# Patient Record
Sex: Female | Born: 1980 | Race: Black or African American | Hispanic: No | Marital: Single | Smoking: Never smoker
Health system: Southern US, Community
[De-identification: ages and names within clinical notes are randomized; demographics above are authoritative.]

## PROBLEM LIST (undated history)

## (undated) DIAGNOSIS — T7840XA Allergy, unspecified, initial encounter: Secondary | ICD-10-CM

---

## 2021-07-29 ENCOUNTER — Encounter (HOSPITAL_COMMUNITY): Payer: Self-pay | Admitting: Emergency Medicine

## 2021-07-29 ENCOUNTER — Emergency Department (HOSPITAL_COMMUNITY): Payer: Medicaid Other

## 2021-07-29 ENCOUNTER — Inpatient Hospital Stay (HOSPITAL_COMMUNITY)
Admission: EM | Admit: 2021-07-29 | Discharge: 2021-07-31 | DRG: 880 | Disposition: A | Discharge status: Home / Self Care | Payer: Medicaid Other | Attending: Neurology | Admitting: Neurology

## 2021-07-29 ENCOUNTER — Other Ambulatory Visit: Payer: Self-pay

## 2021-07-29 DIAGNOSIS — M62838 Other muscle spasm: Secondary | ICD-10-CM | POA: Diagnosis present

## 2021-07-29 DIAGNOSIS — F447 Conversion disorder with mixed symptom presentation: Principal | ICD-10-CM | POA: Diagnosis present

## 2021-07-29 DIAGNOSIS — R29701 NIHSS score 1: Secondary | ICD-10-CM | POA: Diagnosis present

## 2021-07-29 DIAGNOSIS — Z91013 Allergy to seafood: Secondary | ICD-10-CM | POA: Diagnosis not present

## 2021-07-29 DIAGNOSIS — Z6834 Body mass index (BMI) 34.0-34.9, adult: Secondary | ICD-10-CM | POA: Diagnosis not present

## 2021-07-29 DIAGNOSIS — F4489 Other dissociative and conversion disorders: Secondary | ICD-10-CM | POA: Diagnosis not present

## 2021-07-29 DIAGNOSIS — E669 Obesity, unspecified: Secondary | ICD-10-CM | POA: Diagnosis present

## 2021-07-29 DIAGNOSIS — R2990 Unspecified symptoms and signs involving the nervous system: Secondary | ICD-10-CM | POA: Diagnosis not present

## 2021-07-29 DIAGNOSIS — E785 Hyperlipidemia, unspecified: Secondary | ICD-10-CM | POA: Diagnosis not present

## 2021-07-29 DIAGNOSIS — Z20822 Contact with and (suspected) exposure to covid-19: Secondary | ICD-10-CM | POA: Diagnosis present

## 2021-07-29 DIAGNOSIS — T781XXA Other adverse food reactions, not elsewhere classified, initial encounter: Secondary | ICD-10-CM | POA: Diagnosis present

## 2021-07-29 DIAGNOSIS — Z79899 Other long term (current) drug therapy: Secondary | ICD-10-CM

## 2021-07-29 DIAGNOSIS — I639 Cerebral infarction, unspecified: Secondary | ICD-10-CM | POA: Diagnosis present

## 2021-07-29 DIAGNOSIS — I6389 Other cerebral infarction: Secondary | ICD-10-CM | POA: Diagnosis not present

## 2021-07-29 DIAGNOSIS — G8194 Hemiplegia, unspecified affecting left nondominant side: Secondary | ICD-10-CM | POA: Diagnosis present

## 2021-07-29 DIAGNOSIS — Z713 Dietary counseling and surveillance: Secondary | ICD-10-CM

## 2021-07-29 DIAGNOSIS — T7840XA Allergy, unspecified, initial encounter: Principal | ICD-10-CM

## 2021-07-29 DIAGNOSIS — R29810 Facial weakness: Secondary | ICD-10-CM | POA: Diagnosis present

## 2021-07-29 DIAGNOSIS — I10 Essential (primary) hypertension: Secondary | ICD-10-CM | POA: Diagnosis present

## 2021-07-29 HISTORY — DX: Allergy, unspecified, initial encounter: T78.40XA

## 2021-07-29 LAB — DIFFERENTIAL
Abs Immature Granulocytes: 0.06 10*3/uL (ref 0.00–0.07)
Basophils Absolute: 0 10*3/uL (ref 0.0–0.1)
Basophils Relative: 0 %
Eosinophils Absolute: 0 10*3/uL (ref 0.0–0.5)
Eosinophils Relative: 0 %
Immature Granulocytes: 1 %
Lymphocytes Relative: 12 %
Lymphs Abs: 1.3 10*3/uL (ref 0.7–4.0)
Monocytes Absolute: 0.3 10*3/uL (ref 0.1–1.0)
Monocytes Relative: 2 %
Neutro Abs: 9.1 10*3/uL — ABNORMAL HIGH (ref 1.7–7.7)
Neutrophils Relative %: 85 %

## 2021-07-29 LAB — CBC
HCT: 41.7 % (ref 36.0–46.0)
Hemoglobin: 14.3 g/dL (ref 12.0–15.0)
MCH: 31.5 pg (ref 26.0–34.0)
MCHC: 34.3 g/dL (ref 30.0–36.0)
MCV: 91.9 fL (ref 80.0–100.0)
Platelets: 304 10*3/uL (ref 150–400)
RBC: 4.54 MIL/uL (ref 3.87–5.11)
RDW: 13 % (ref 11.5–15.5)
WBC: 10.8 10*3/uL — ABNORMAL HIGH (ref 4.0–10.5)
nRBC: 0 % (ref 0.0–0.2)

## 2021-07-29 LAB — COMPREHENSIVE METABOLIC PANEL
ALT: 19 U/L (ref 0–44)
AST: 30 U/L (ref 15–41)
Albumin: 3.6 g/dL (ref 3.5–5.0)
Alkaline Phosphatase: 72 U/L (ref 38–126)
Anion gap: 7 (ref 5–15)
BUN: 7 mg/dL (ref 6–20)
CO2: 23 mmol/L (ref 22–32)
Calcium: 9.3 mg/dL (ref 8.9–10.3)
Chloride: 108 mmol/L (ref 98–111)
Creatinine, Ser: 0.92 mg/dL (ref 0.44–1.00)
GFR, Estimated: >60 mL/min (ref >60)
Glucose, Bld: 107 mg/dL — ABNORMAL HIGH (ref 70–99)
Potassium: 3.9 mmol/L (ref 3.5–5.1)
Sodium: 138 mmol/L (ref 135–145)
Total Bilirubin: 0.5 mg/dL (ref 0.3–1.2)
Total Protein: 7.6 g/dL (ref 6.5–8.1)

## 2021-07-29 LAB — APTT: aPTT: 31 seconds (ref 24–36)

## 2021-07-29 LAB — CBG MONITORING, ED: Glucose-Capillary: 109 mg/dL — ABNORMAL HIGH (ref 70–99)

## 2021-07-29 LAB — RESP PANEL BY RT-PCR (FLU A&B, COVID) ARPGX2
Influenza A by PCR: NEGATIVE
Influenza B by PCR: NEGATIVE
SARS Coronavirus 2 by RT PCR: NEGATIVE

## 2021-07-29 LAB — PROTIME-INR
INR: 0.9 (ref 0.8–1.2)
Prothrombin Time: 12.4 seconds (ref 11.4–15.2)

## 2021-07-29 LAB — I-STAT BETA HCG BLOOD, ED (MC, WL, AP ONLY): I-stat hCG, quantitative: <5 m[IU]/mL (ref <5)

## 2021-07-29 LAB — ETHANOL: Alcohol, Ethyl (B): <10 mg/dL (ref <10)

## 2021-07-29 IMAGING — CT CT HEAD CODE STROKE
3 series · 15 of 47 positions shown, 18 images · non-contrast
Comparison: None.

CLINICAL DATA: Code stroke.  Allergic reaction



[Series 4: head wo · axial · 0.44mm/px · z∈[+1198,+1333]mm · 9 of 33 slices shown, 12 images]
[im 3/33  brain]
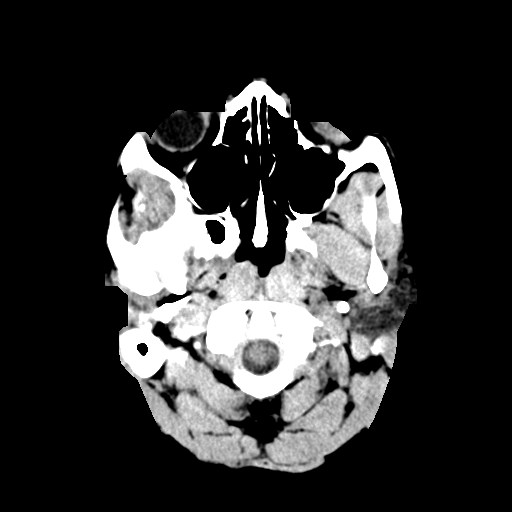
[im 3/33  bone]
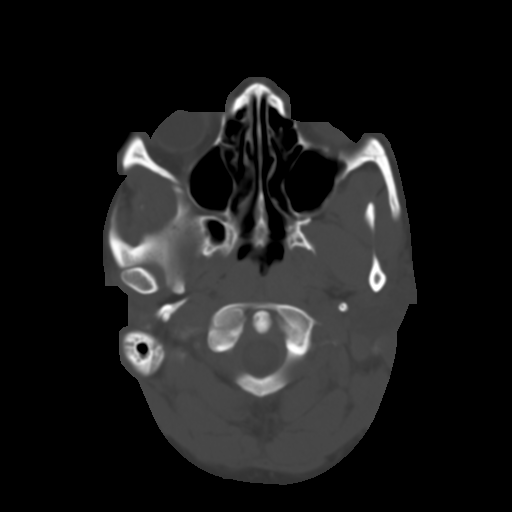
[im 6/33  brain]
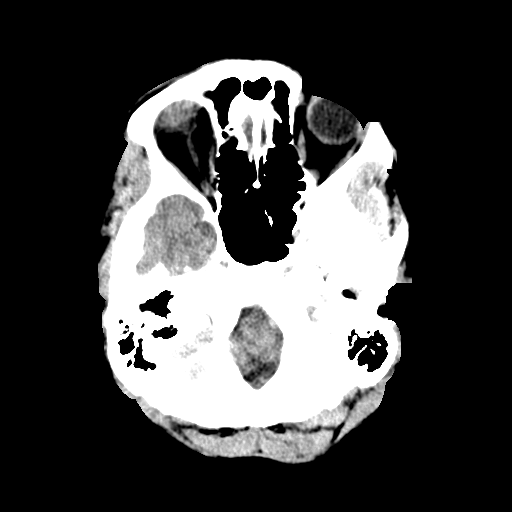
[im 9/33  brain]
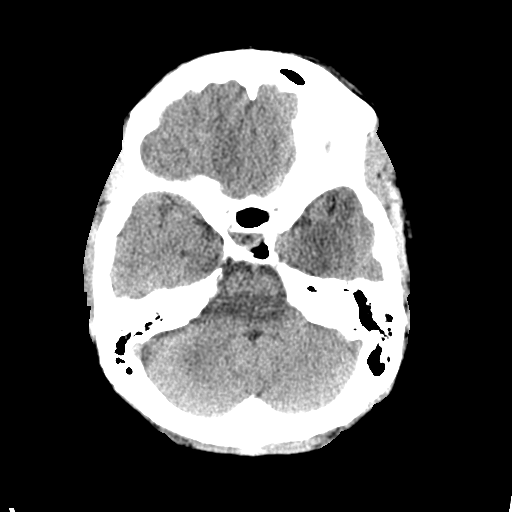
[im 13/33  brain]
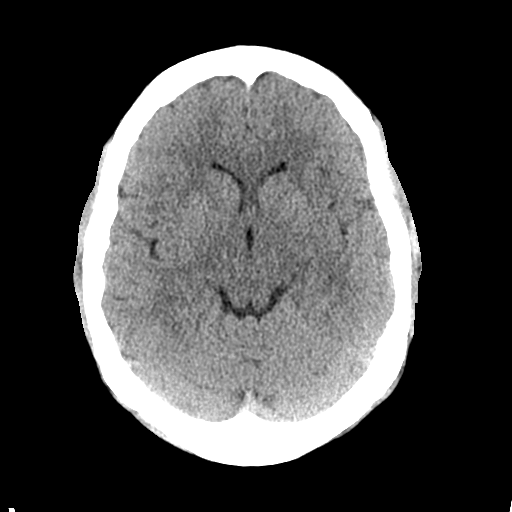
[im 17/33  brain]
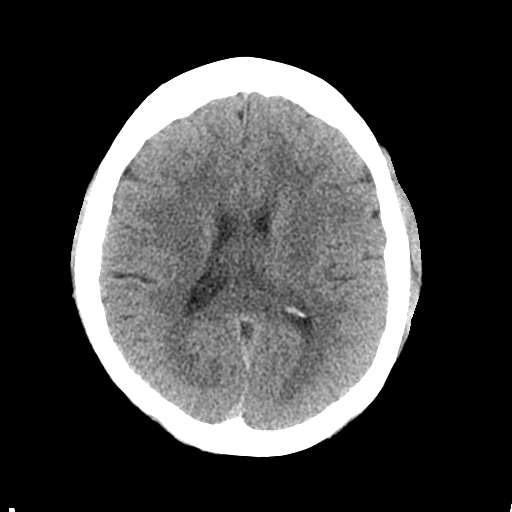
[im 17/33  bone]
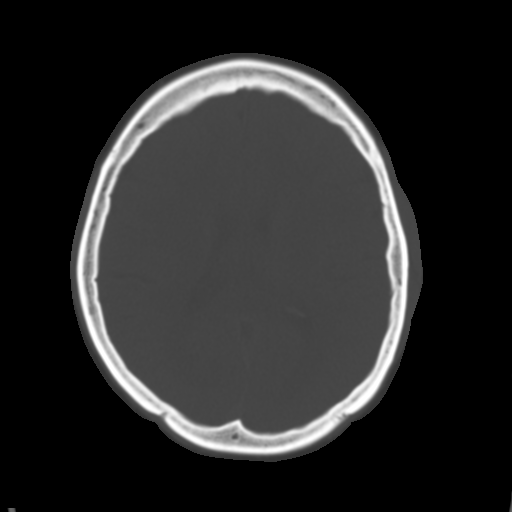
[im 20/33  brain]
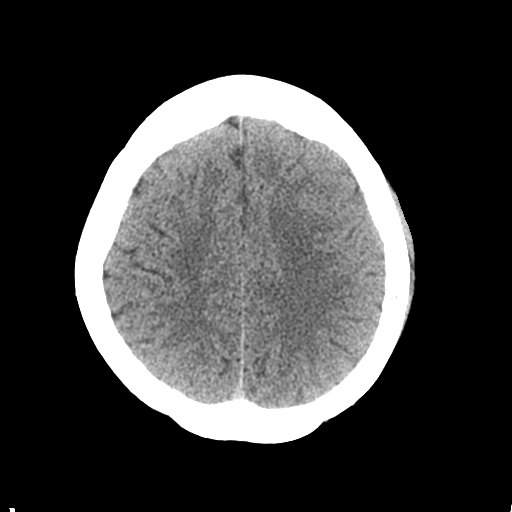
[im 24/33  brain]
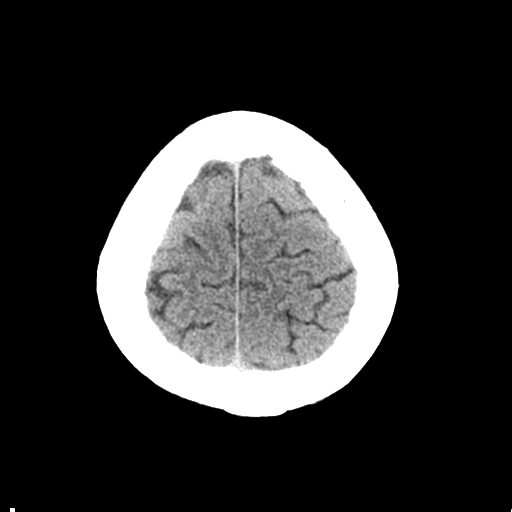
[im 27/33  brain]
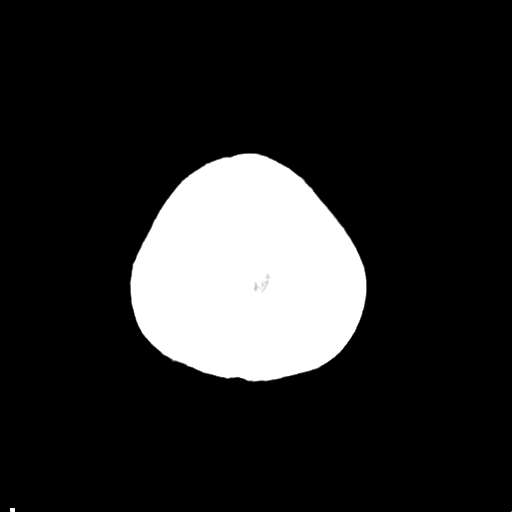
[im 30/33  brain]
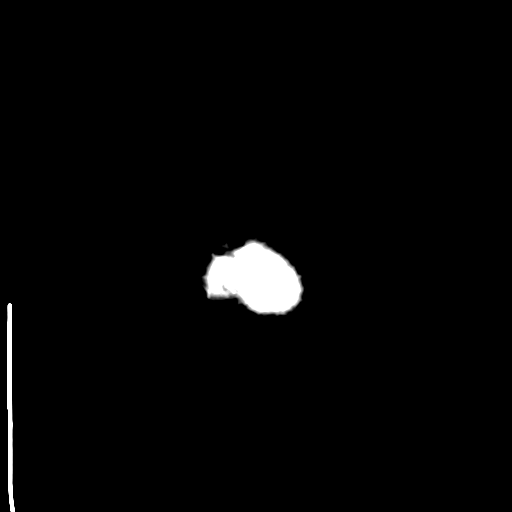
[im 30/33  bone]
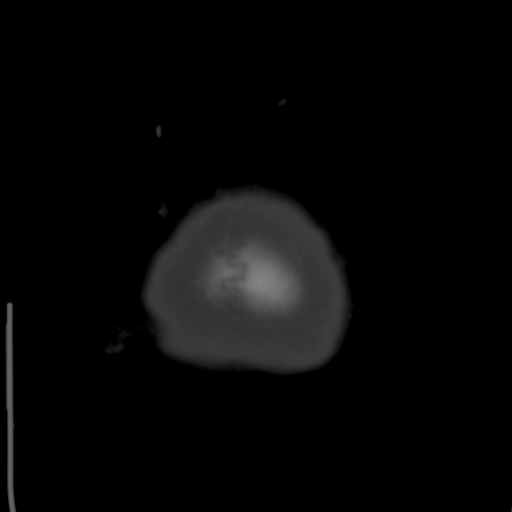

[Series 7: coronal soft tissue · coronal · 0.33mm/px · 3 of 72 slices shown]
[im 24/72  brain]
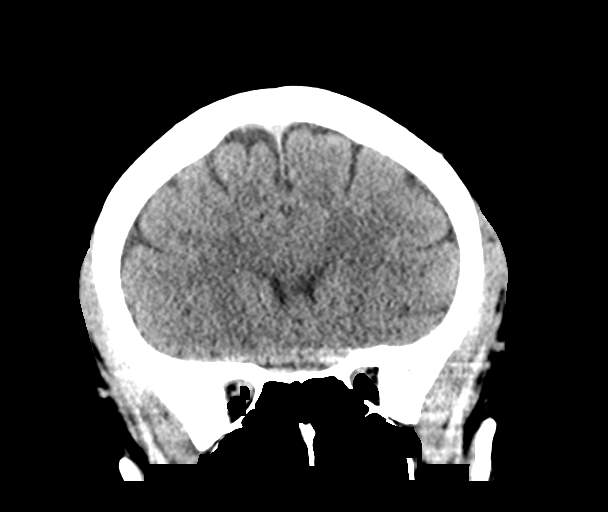
[im 32/72  brain]
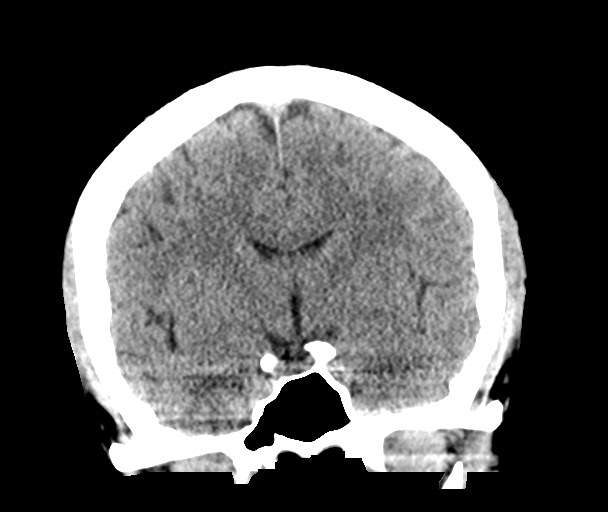
[im 40/72  brain]
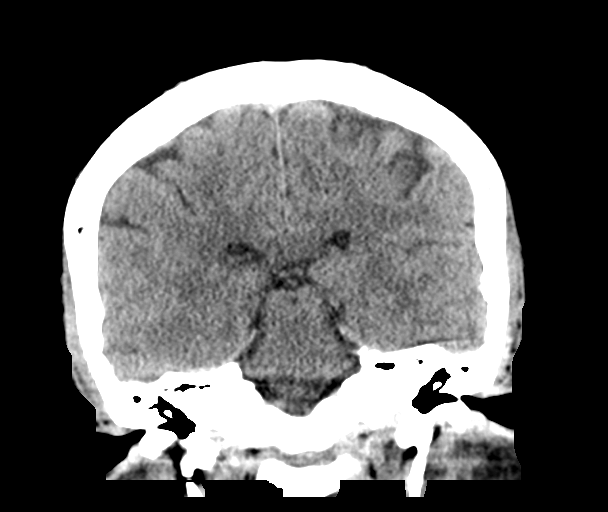

[Series 8: sagittal soft tissue · sagittal · 0.32mm/px · 3 of 64 slices shown]
[im 22/64  brain]
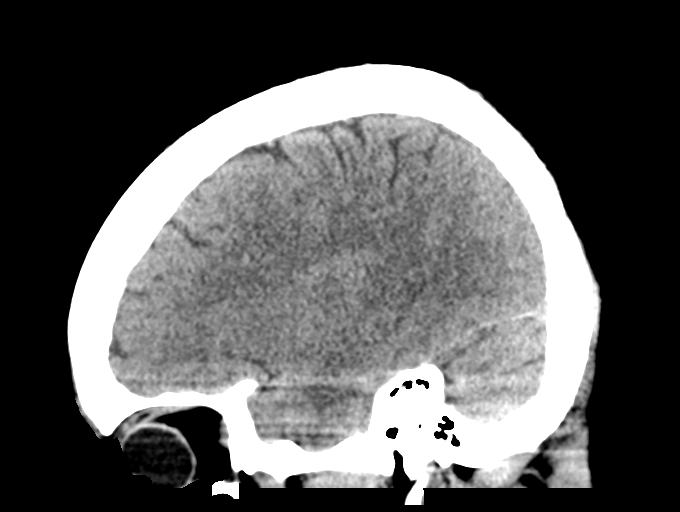
[im 32/64  brain]
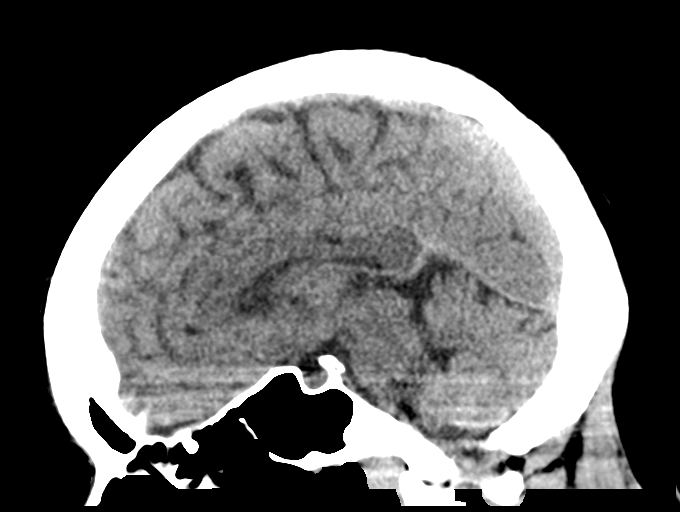
[im 43/64  brain]
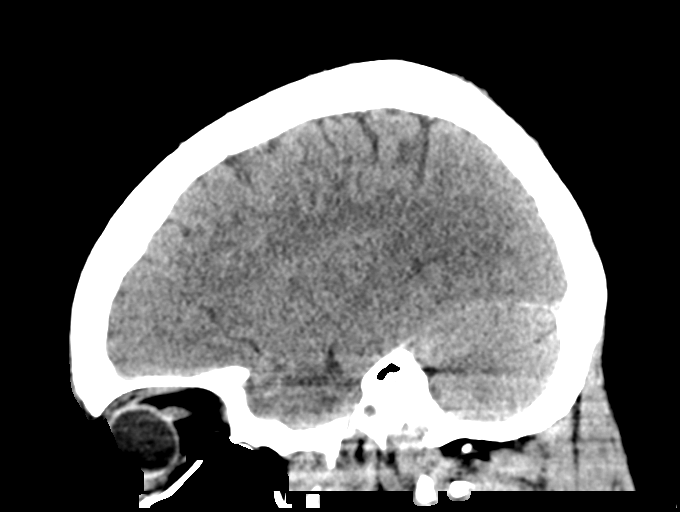

[15 of 47 positions shown; findings below may reference images not displayed]

FINDINGS: Brain: There is no mass, hemorrhage or extra-axial collection. The
size and configuration of the ventricles and extra-axial CSF spaces
are normal. The brain parenchyma is normal, without evidence of
acute or chronic infarction.

Vascular: No abnormal hyperdensity of the major intracranial
arteries or dural venous sinuses. No intracranial atherosclerosis.

Skull: The visualized skull base, calvarium and extracranial soft
tissues are normal.

Sinuses/Orbits: No fluid levels or advanced mucosal thickening of
the visualized paranasal sinuses. No mastoid or middle ear effusion.
The orbits are normal.

ASPECTS (Alberta Stroke Program Early CT Score)

- Ganglionic level infarction (caudate, lentiform nuclei, internal
capsule, insula, M1-M3 cortex): 7

- Supraganglionic infarction (M4-M6 cortex): 3

Total score (0-10 with 10 being normal): 10
IMPRESSION: 1. Normal head CT.
2. ASPECTS is 10.

## 2021-07-29 MED ORDER — SODIUM CHLORIDE (PF) 0.9 % IJ SOLN
INTRAMUSCULAR | Status: AC
  Filled 2021-07-29: qty 100

## 2021-07-29 MED ORDER — FENTANYL CITRATE PF 50 MCG/ML IJ SOSY
25.0000 ug | PREFILLED_SYRINGE | Freq: Once | INTRAMUSCULAR | Status: AC
  Administered 2021-07-29: 25 ug via INTRAVENOUS
  Filled 2021-07-29: qty 1

## 2021-07-29 MED ORDER — FAMOTIDINE IN NACL 20-0.9 MG/50ML-% IV SOLN
20.0000 mg | Freq: Once | INTRAVENOUS | Status: AC
  Administered 2021-07-29: 20 mg via INTRAVENOUS
  Filled 2021-07-29: qty 50

## 2021-07-29 MED ORDER — SODIUM CHLORIDE 0.9 % IV BOLUS
500.0000 mL | Freq: Once | INTRAVENOUS | Status: AC
  Administered 2021-07-29: 500 mL via INTRAVENOUS

## 2021-07-29 MED ORDER — METHYLPREDNISOLONE SODIUM SUCC 125 MG IJ SOLR
125.0000 mg | Freq: Once | INTRAMUSCULAR | Status: AC
  Administered 2021-07-29: 125 mg via INTRAVENOUS
  Filled 2021-07-29: qty 2

## 2021-07-29 MED ORDER — KETOROLAC TROMETHAMINE 15 MG/ML IJ SOLN
15.0000 mg | Freq: Once | INTRAMUSCULAR | Status: AC
  Administered 2021-07-29: 15 mg via INTRAVENOUS
  Filled 2021-07-29: qty 1

## 2021-07-29 MED ORDER — IPRATROPIUM-ALBUTEROL 0.5-2.5 (3) MG/3ML IN SOLN
3.0000 mL | Freq: Once | RESPIRATORY_TRACT | Status: AC
  Administered 2021-07-29: 3 mL via RESPIRATORY_TRACT
  Filled 2021-07-29: qty 3

## 2021-07-29 MED ORDER — TENECTEPLASE FOR STROKE
0.2500 mg/kg | PACK | Freq: Once | INTRAVENOUS | Status: AC
  Administered 2021-07-29: 21 mg via INTRAVENOUS
  Filled 2021-07-29: qty 10

## 2021-07-29 NOTE — Progress Notes (Signed)
PHARMACIST CODE STROKE RESPONSE ? ?Notified to mix TNK at 2139 by Dr. Blanca Friend ?Delivered TNK to RN, Maylon Cos, at 2145 ? ?TNK dose = 21 mg IV over 5 seconds ? ?Issues/delays encountered (if applicable): N/A ? ?Lindell Spar, PharmD, BCPS ?Clinical Pharmacist  ?07/29/21 10:08 PM ? ?

## 2021-07-29 NOTE — ED Provider Notes (Signed)
?[facility]-EMERGENCY DEPT ?Provider Note ? ? ?CSN: [id number] ?Arrival date & time: 07/29/21  1610 ? ?  ? ?History ? ?Chief Complaint  ?Patient presents with  ? Allergic Reaction  ? ? ?[name] [name] is a 41 y.o. female. ? ? ?Allergic Reaction ?Presenting symptoms: no rash   ?Patient presents after allergic reaction.  Given epinephrine by EMS.  Reportedly was exposed to some seafood in the break room.  States that she is allergic.  Did not eat any food that she was worried about.  Some difficulty breathing.  No lightheadedness.  No fevers.  Has had to come to the ER before but never had to be intubated for allergic reaction.  Feels as if she is having trouble breathing. ?  ? ?Home Medications ?Prior to Admission medications   ?Medication Sig Start Date End Date Taking? Authorizing Provider  ?amitriptyline (ELAVIL) 50 MG tablet Take 50 mg by mouth at bedtime.   Yes [provider]  ?OVER THE COUNTER MEDICATION Take 1 tablet by mouth daily. Nitro-Oxygen Booster   Yes [provider]  ?Turmeric 500 MG CAPS Take 1 capsule by mouth daily.   Yes [provider]  ?   ? ?Allergies    ?Iodine, Shellfish allergy, Benadryl [diphenhydramine], and Demerol [meperidine hcl]   ? ?Review of Systems   ?Review of Systems  ?Constitutional:  Negative for appetite change.  ?Respiratory:  Positive for shortness of breath.   ?Cardiovascular:  Negative for chest pain.  ?Gastrointestinal:  Negative for abdominal pain.  ?Skin:  Negative for rash.  ? ?Physical Exam ?Updated Vital Signs ?BP 120/76   Pulse 83   Temp 97.7 ?F (36.5 ?C)   Resp 12   Ht 5\' 2"  (1.575 m)   Wt 85.7 kg   SpO2 98%   BMI 34.57 kg/m?  ?Physical Exam ?Vitals and nursing note reviewed.  ?HENT:  ?   Head: Atraumatic.  ?Eyes:  ?   Pupils: Pupils are equal, round, and reactive to light.  ?Cardiovascular:  ?   Rate and Rhythm: Regular rhythm.  ?Pulmonary:  ?   Comments: Patient speaking with a quiet voice.  No frank wheezing.   No posterior pharyngeal edema seen.  No stridor. ?Musculoskeletal:     ?   General: No tenderness.  ?   Cervical back: Neck supple.  ?Skin: ?   Capillary Refill: Capillary refill takes less than 2 seconds.  ?Neurological:  ?   Mental Status: She is alert and oriented to person, place, and time.  ? ? ?ED Results / Procedures / Treatments   ?Labs ?(all labs ordered are listed, but only abnormal results are displayed) ?Labs Reviewed  ?CBC - Abnormal; Notable for the following components:  ?    Result Value  ? WBC 10.8 (*)   ? All other components within normal limits  ?DIFFERENTIAL - Abnormal; Notable for the following components:  ? Neutro Abs 9.1 (*)   ? All other components within normal limits  ?COMPREHENSIVE METABOLIC PANEL - Abnormal; Notable for the following components:  ? Glucose, Bld 107 (*)   ? All other components within normal limits  ?CBG MONITORING, ED - Abnormal; Notable for the following components:  ? Glucose-Capillary 109 (*)   ? All other components within normal limits  ?RESP PANEL BY RT-PCR (FLU A&B, COVID) ARPGX2  ?ETHANOL  ?PROTIME-INR  ?APTT  ?RAPID URINE DRUG SCREEN, HOSP PERFORMED  ?URINALYSIS, ROUTINE W REFLEX MICROSCOPIC  ?I-STAT CHEM 8, ED  ?I-STAT BETA  HCG BLOOD, ED ([facility], [facility], [facility] ONLY)  ? ? ?EKG ?None ? ?Radiology ?CT HEAD CODE STROKE WO CONTRAST ? ?Result Date: 07/29/2021 ?CLINICAL DATA:  Code stroke.  Allergic reaction EXAM: CT HEAD WITHOUT CONTRAST TECHNIQUE: Contiguous axial images were obtained from the base of the skull through the vertex without intravenous contrast. RADIATION DOSE REDUCTION: This exam was performed according to the departmental dose-optimization program which includes automated exposure control, adjustment of the mA and/or kV according to patient size and/or use of iterative reconstruction technique. COMPARISON:  None. FINDINGS: Brain: There is no mass, hemorrhage or extra-axial collection. The size and configuration of the ventricles and extra-axial CSF spaces are  normal. The brain parenchyma is normal, without evidence of acute or chronic infarction. Vascular: No abnormal hyperdensity of the major intracranial arteries or dural venous sinuses. No intracranial atherosclerosis. Skull: The visualized skull base, calvarium and extracranial soft tissues are normal. Sinuses/Orbits: No fluid levels or advanced mucosal thickening of the visualized paranasal sinuses. No mastoid or middle ear effusion. The orbits are normal. ASPECTS [facility](Alberta Stroke Program Early CT Score) - Ganglionic level infarction (caudate, lentiform nuclei, internal capsule, insula, M1-M3 cortex): 7 - Supraganglionic infarction (M4-M6 cortex): 3 Total score (0-10 with 10 being normal): 10 IMPRESSION: 1. Normal head CT. 2. ASPECTS is 10. Electronically Signed   By: Deatra RobinsonKevin  Herman M.D.   On: 07/29/2021 21:20   ? ?Procedures ?Procedures  ? ? ?Medications Ordered in ED ?Medications  ?sodium chloride (PF) 0.9 % injection (has no administration in time range)  ?famotidine (PEPCID) IVPB 20 mg premix (0 mg Intravenous Stopped 07/29/21 1717)  ?sodium chloride 0.9 % bolus 500 mL (0 mLs Intravenous Stopped 07/29/21 1752)  ?methylPREDNISolone sodium succinate (SOLU-MEDROL) 125 mg/2 mL injection 125 mg (125 mg Intravenous Given 07/29/21 1646)  ?ipratropium-albuterol (DUONEB) 0.5-2.5 (3) MG/3ML nebulizer solution 3 mL (3 mLs Nebulization Given 07/29/21 1642)  ?fentaNYL (SUBLIMAZE) injection 25 mcg (25 mcg Intravenous Given 07/29/21 1820)  ?ketorolac (TORADOL) 15 MG/ML injection 15 mg (15 mg Intravenous Given 07/29/21 1929)  ?tenecteplase (TNKASE) injection for Stroke 21 mg (21 mg Intravenous Given 07/29/21 2153)  ? ? ?ED Course/ Medical Decision Making/ A&P ?  ?                        ?Medical Decision Making ?Amount and/or Complexity of Data Reviewed ?Labs: ordered. ?Radiology: ordered. ? ?Risk ?Prescription drug management. ?Decision regarding hospitalization. ? ? ?Patient with potential allergic reaction to seafood.  History of  allergy to this.  Quiet voice initially.  Given epinephrine by EMS.  Given steroids and Pepcid here.  Also some abdominal pain.  Patient later states that she had pain on her whole left side.  Patient somewhat hesitant to move the side but does appear to have full strength.  Benign abdominal exam. ? ?With further evaluation patient may now be developing a little bit of a facial droop.  Still able to lift left side but states she now cannot feel me touching on that side.  Pain somewhat improved.  Code stroke called.  Last normal had been approximately 6:00 today. ? ?Seen by teleneurology.  For his exam states it was just some weakness in the left leg but will not walk.  Low NIH score but things would be debilitating.  He is worried that she has had previous hemorrhagic cyst.  Discussed with gynecology and thinks this is not an issue to worry about with tPA.  Neurology also thought that the  patient could have conversion reaction.  However with the weakness in the leg he elected to order TNK.  I discussed with Dr. Amada Jupiter from neurology, who accepted patient in transfer to [facility]. ? 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  Will not give CTA at this time because of that.  Has had steroids but she is already here for allergic reaction adding a potential allergen at this time I think is higher risk.  We will likely get MRI at [facility] ? ?CRITICAL CARE ?Performed by: Benjiman Core ?Total critical care time: 30 minutes ?Critical care time was exclusive of separately billable procedures and treating other patients. ?Critical care was necessary to treat or prevent imminent or life-threatening deterioration. ?Critical care was time spent personally by me on the following activities: development of treatment plan with patient and/or surrogate as well as nursing, discussions with consultants, evaluation of patient's response to treatment, examination of patient, obtaining history from patient or  surrogate, ordering and performing treatments and interventions, ordering and review of laboratory studies, ordering and review of radiographic studies, pulse oximetry and re-evaluation of patient's condition. ? ? ?

## 2021-07-29 NOTE — Consult Note (Addendum)
[facility] ?[facility] Tele[facility]urology Consult Services ? ? ?Patient Name:   [name] [name] ?Date of Birth:   Sep 30, 1980 ?Identification Number:   MRN - [id number] ?Date of Service:   07/29/2021 21:15:57 ? ?Diagnosis: ?      R53.1 - Weakness ? ?Impression: ?     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 Fire department gave her epi and transported her to the hospital. While in the ED patient developed left facial droop and left sided weakness after she returned from using the bathroom. Last known well time was 20:33. Exam is significant for left leg drift. Patient is not able to ambulate without assistance. Risks and benefits of IV TNK was discussed with patient including a approximate 3 percent risk of symptomatic intracranial hemorrhage, and internal bleeding. I also discussed some of the potential benefits of IV alteplase to include an approximate 30 % chance of improvement at 3 months. ED provider discussed the patient having a history hemorrhagic ovarian cyst with OBGYN and OBGYN considered the hemorrhagic ovarian cyst to be a negligible bleed risk with thrombolytic therapy. IV TNK was given.  ? Recommend admission for stroke work up. ? ?Our recommendations are outlined below. ?Recommendations: ?IV Tenecteplase recommended. ? ?I confirmed the following. ?(Patient name, DOB, MRN, dose of Thrombolytic and waste, weight completed by stretcher/scale not stated weight, have ED staff inform ED MD of thrombolytic decision) ?Thrombolytic bolus given Without Complication. ? ? ?IV Tenecteplase Total Dose - 21.4 mg ? ? ?Routine post Thrombolytic monitoring including neuro checks and blood pressure control during/after treatment Monitor blood pressure Check blood pressure and neuro assessment every 15 min for 2 h, then every 30 min for 6 h, and finally  every hour for 16 h. ? ?Manage Blood Pressure per post Thrombolytic protocol. ? ?      Follow designated hospital protocol for admission and post thrombolytic care ?      CT brain 24 hours post Thrombolytic ?      NPO until swallowing screen performed and passed ?      No antiplatelet agents or anticoagulants (including heparin for DVT prophylaxis) in first 24 hours ?      No Foley catheter, nasogastric tube, arterial catheter or central venous catheter for 24 hr, unless absolutely necessary ?      Telemetry ?      Bedside swallow evaluation ?      HOB less than 30 degrees ?      Euglycemia ?      Avoid hyperthermia, PRN acetaminophen ?      DVT prophylaxis ?      Inpatient Neurology Consultation ?      Stroke evaluation as per inpatient neurology recommendations ? ?Discussed with ED physician ? ? ? ?------------------------------------------------------------------------------ ? ?Advanced Imaging: ?Advanced Imaging Deferred because: ? ?Non-disabling symptoms as verified by the patient; no cortical signs so not consistent with LVO ? ? ?Metrics: ?Last Known Well: 07/29/2021 20:33:00 ?[facility] Notification Time: 07/29/2021 21:15:57 ?Stamp Time: 07/29/2021 21:15:57 ?Initial Response Time: 07/29/2021 21:19:22 ?Symptoms: Allergy reaction and right leg weakness. Marland Kitchen ?NIHSS Start Assessment Time: 07/29/2021 22:24:00 ?Patient is a candidate for Thrombolytic. ?Thrombolytic Medical Decision: 07/29/2021 21:38:09 ?Needle Time: 07/29/2021 21:53:00 ?Weight Noted by Staff: 85.7 kg ? ?CT head showed no acute hemorrhage or acute core infarct. ? ?Primary Provider Notified of Diagnostic Impression and  Management Plan on: 07/29/2021 22:17:37 ? ? ? ?------------------------------------------------------------------------------ ? ?Thrombolytic Contraindications: ? ?Last Known Well > 4.5 hours: No ?CT Head showing hemorrhage: No ?Ischemic stroke within 3 months: No ?Severe head trauma within 3 months: No ?Intracranial/intraspinal  surgery within 3 months: No ?History of intracranial hemorrhage: No ?Symptoms and signs consistent with an SAH: No ?GI malignancy or GI bleed within 21 days: No ?Coagulopathy: Platelets <100 000 /mm3, INR >1.7, aPTT>40 s, or PT >15 s: No ?Treatment dose of LMWH within the previous 24 hrs: No ?Use of NOACs in past 48 hours: No ?Glycoprotein IIb/IIIa receptor inhibitors use: No ?Symptoms consistent with infective endocarditis: No ?Suspected aortic arch dissection: No ?Intra-axial intracranial neoplasm: No ? ? ?History of Present Illness: ?Patient is a 41 year old Female. ? 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 Fire department gave her epi and transported her to the hospital. While in the ED patient developed left facial droop and left sided weakness after she returned from using the bathroom. Last known well time was 20:33. Exam is significant for left leg drift. Patient is not able to ambulate without assistance. Risks and benefits of IV TNK was discussed with patient including a approximate 3 percent risk of symptomatic intracranial hemorrhage, and internal bleeding. I also discussed some of the potential benefits of IV alteplase to include an approximate 30 % chance of improvement at 3 months. ED provider discussed the patient having a history hemorrhagic ovarian cyst with OBGYN and OBGYN considered the hemorrhagic ovarian cyst to be a negligible bleed risk with thrombolytic therapy. ? ? ?  ? ?Medications: ? ?No Anticoagulant use  ?No Antiplatelet use ?Reviewed EMR for current medications ? ?Allergies:  ?Reviewed ? ?Social History: ?Drug Use: No ? ?Family History: ? ?There is no family history of premature cerebrovascular disease pertinent to this consultation ? ?ROS : ?14 Points Review of Systems was performed and was negative except mentioned in  HPI. ? ?Past Surgical History: ?There Is No Surgical History Contributory To Today?s Visit ? ?  ?Examination: ?BP(128/76), Pulse(82), Blood Glucose(109) ?1A: Level of Consciousness - Alert; keenly responsive + 0 ?1B: Ask Month and Age - Both Questions Right + 0 ?1C: Blink Eyes & Squeeze Hands - Performs Both Tasks + 0 ?2: Test Horizontal Extraocular Movements - Normal + 0 ?3: Test Visual Fields - No Visual Loss + 0 ?4: Test Facial Palsy (Use Grimace if Obtunded) - Normal symmetry + 0 ?5A: Test Left Arm Motor Drift - No Drift for 10 Seconds + 0 ?5B: Test Right Arm Motor Drift - No Drift for 10 Seconds + 0 ?6A: Test Left Leg Motor Drift - No Drift for 5 Seconds + 0 ?6B: Test Right Leg Motor Drift - Drift, but doesn't hit bed + 1 ?7: Test Limb Ataxia (FNF/Heel-Shin) - No Ataxia + 0 ?8: Test Sensation - Normal; No sensory loss + 0 ?9: Test Language/Aphasia - Normal; No aphasia + 0 ?10: Test Dysarthria - Normal + 0 ?11: Test Extinction/Inattention - No abnormality + 0 ? ?NIHSS Score: 1 ? ?Pre-Morbid Modified Rankin Scale: ?0 Points = No symptoms at all ? ? ?Patient/Family was informed the Neurology Consult would occur via TeleHealth consult by way of interactive audio and video telecommunications and consented to receiving care in this manner. ? ? ?Patient is being evaluated for possible  acute neurologic impairment and high probability of imminent or life-threatening deterioration. I spent total of 27 minutes providing care to this patient, including time for face to face visit via telemedicine, review of medical records, imaging studies and discussion of findings with providers, the patient and/or family. ? ? ?Dr Jessica Priest ? ? ?[facility] ?[phone] ? ?Case [id number] ?  ?

## 2021-07-29 NOTE — ED Triage Notes (Signed)
Pt arrived via EMS from [employer]. Pt was at work. 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 Pt was given 0.3 of epi by Fire. Pt was not given benadryl d/t allergy to benadryl. Pt has been A&Ox4 the whole time EMS has been with patient.  ?

## 2021-07-29 NOTE — Progress Notes (Signed)
Code Stroke Activated @2054 . Patient came into ED for allergic reaction to shellfish @1610 . When she was coming back from the restroom @2033  she had a sudden onset of left sided weakness/numbness and slight left facial droop. mRS 0. Patient left for CT@2107  and returned @2115 . Teleneurologist paged @2115 . Dr. Blanca Friend on stroke cart @2119 . NCCT results given on camera to Dr. Blanca Friend @2120 . Delay in TNK due to need to verify with Ob/Gyn that no contraindication for TNK with ovarian cyst.  ?

## 2021-07-29 NOTE — ED Notes (Signed)
CODE STROKE CALLED PER CHARGE RN ?

## 2021-07-30 ENCOUNTER — Inpatient Hospital Stay (HOSPITAL_COMMUNITY): Payer: Medicaid Other

## 2021-07-30 ENCOUNTER — Encounter (HOSPITAL_COMMUNITY): Payer: Self-pay | Admitting: Neurology

## 2021-07-30 DIAGNOSIS — I6389 Other cerebral infarction: Secondary | ICD-10-CM

## 2021-07-30 DIAGNOSIS — I639 Cerebral infarction, unspecified: Secondary | ICD-10-CM | POA: Diagnosis present

## 2021-07-30 DIAGNOSIS — G8194 Hemiplegia, unspecified affecting left nondominant side: Secondary | ICD-10-CM

## 2021-07-30 LAB — ECHOCARDIOGRAM COMPLETE
AR max vel: 2.4 cm2
AV Area VTI: 2.81 cm2
AV Area mean vel: 2.5 cm2
AV Mean grad: 3 mmHg
AV Peak grad: 6.7 mmHg
Ao pk vel: 1.29 m/s
Area-P 1/2: 3.17 cm2
Height: 62 in
S' Lateral: 2.4 cm
Single Plane A4C EF: 78.5 %
Weight: 3008.84 oz

## 2021-07-30 LAB — LIPID PANEL
Cholesterol: 208 mg/dL — ABNORMAL HIGH (ref 0–200)
HDL: 52 mg/dL (ref >40)
LDL Cholesterol: 148 mg/dL — ABNORMAL HIGH (ref 0–99)
Total CHOL/HDL Ratio: 4 RATIO
Triglycerides: 40 mg/dL (ref <150)
VLDL: 8 mg/dL (ref 0–40)

## 2021-07-30 LAB — HEMOGLOBIN A1C
Hgb A1c MFr Bld: 5.3 % (ref 4.8–5.6)
Mean Plasma Glucose: 105.41 mg/dL

## 2021-07-30 LAB — MRSA NEXT GEN BY PCR, NASAL: MRSA by PCR Next Gen: NOT DETECTED

## 2021-07-30 IMAGING — MR MR MRA HEAD W/O CM
1 series · 19 of 48 positions shown · non-contrast
Comparison: No pertinent prior exam.

CLINICAL DATA: Stroke follow-up.  Allergic reaction.

EXAM:
MRI HEAD WITHOUT CONTRAST
MRA HEAD WITHOUT CONTRAST
TECHNIQUE: Multiplanar, multi-echo pulse sequences of the brain and surrounding
structures were acquired without intravenous contrast. Angiographic
images of the Circle of Willis were acquired using MRA technique
without intravenous contrast.

[Series 5: 3d cow · axial · 0.5mm · 0.41mm/px · z∈[-69,+0]mm · 19 of 160 slices shown]
[im 1/160]
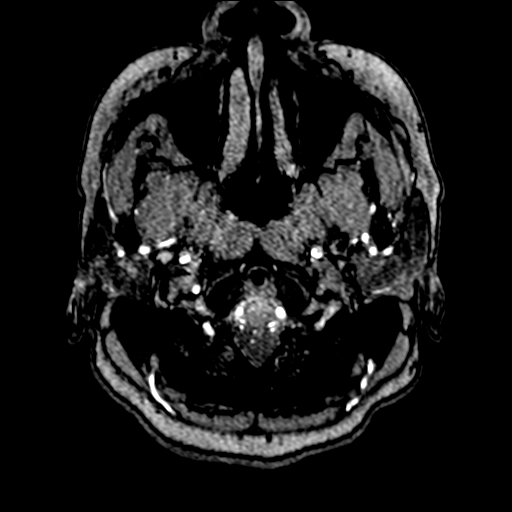
[im 4/160]
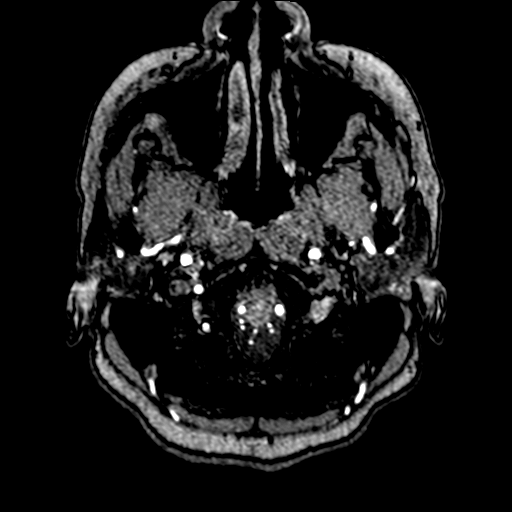
[im 7/160]
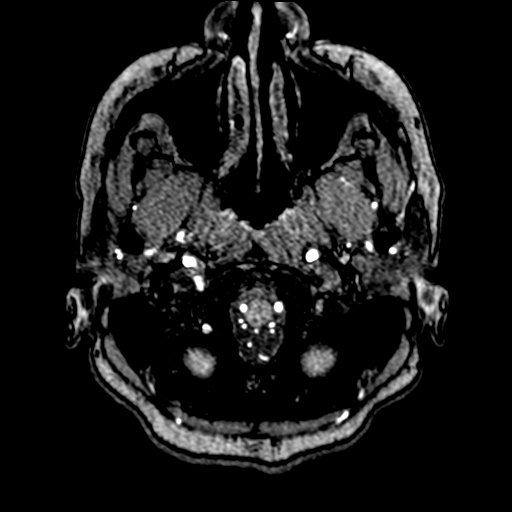
[im 11/160]
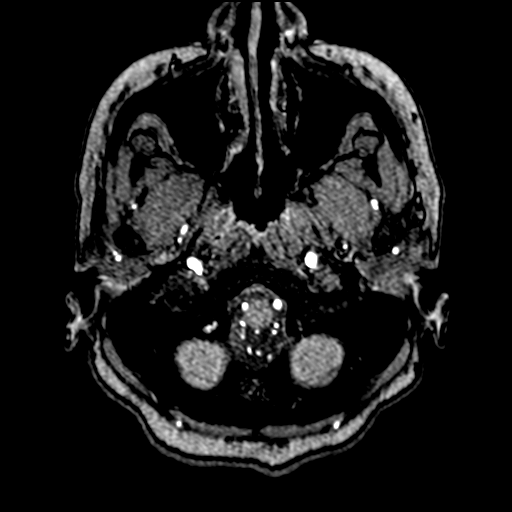
[im 14/160]
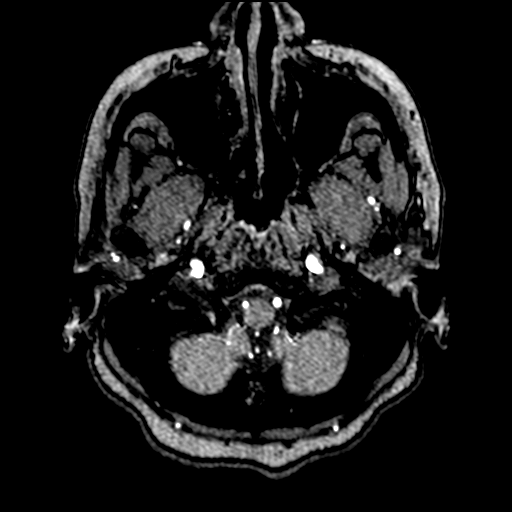
[im 17/160]
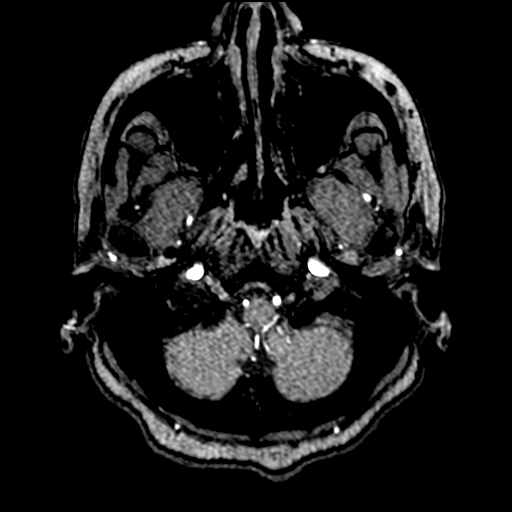
[im 21/160]
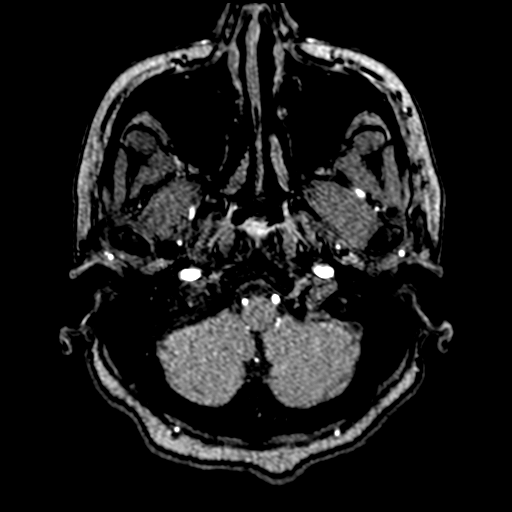
[im 24/160]
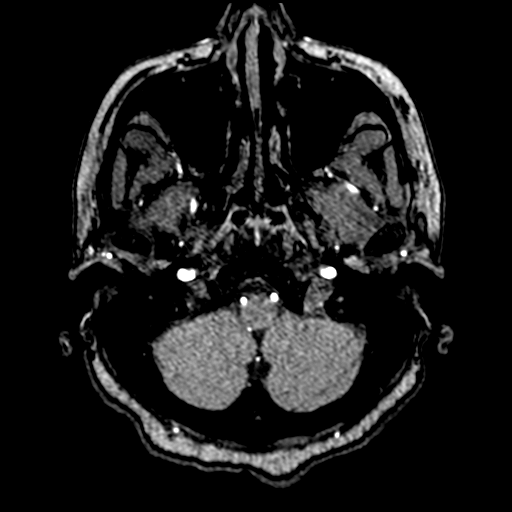
[im 28/160]
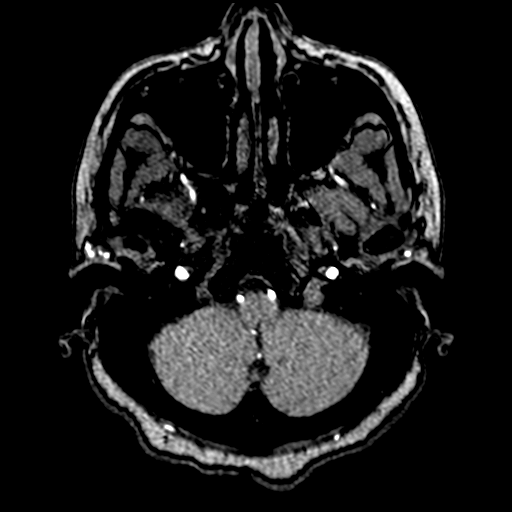
[im 31/160]
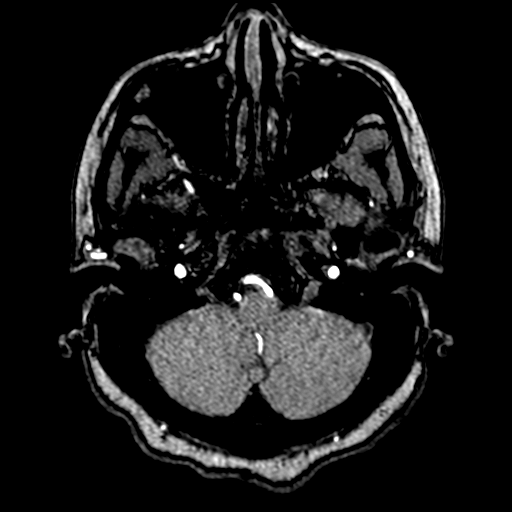
[im 34/160]
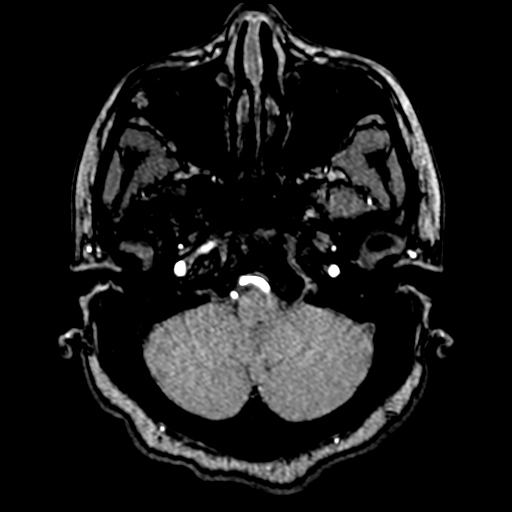
[im 51/160]
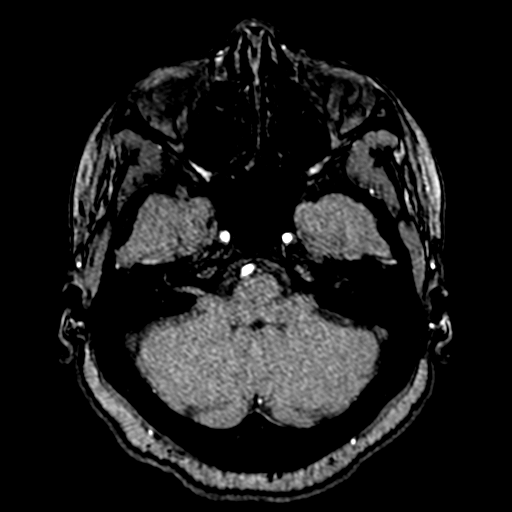
[im 72/160]
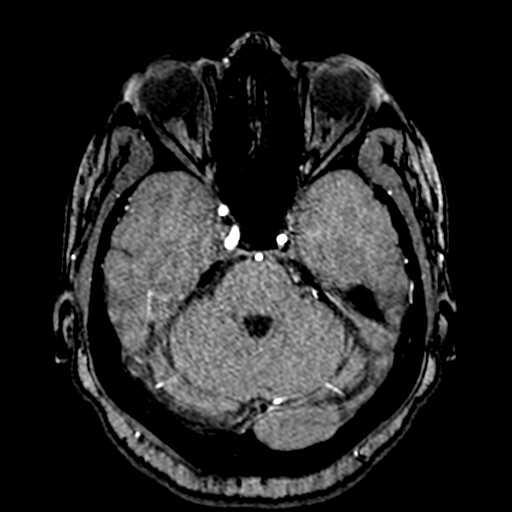
[im 82/160]
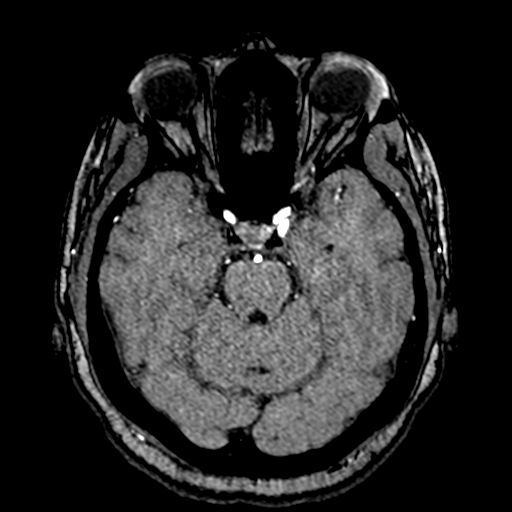
[im 92/160]
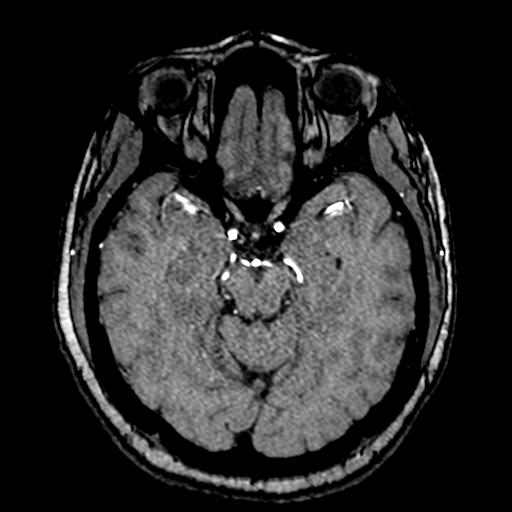
[im 112/160]
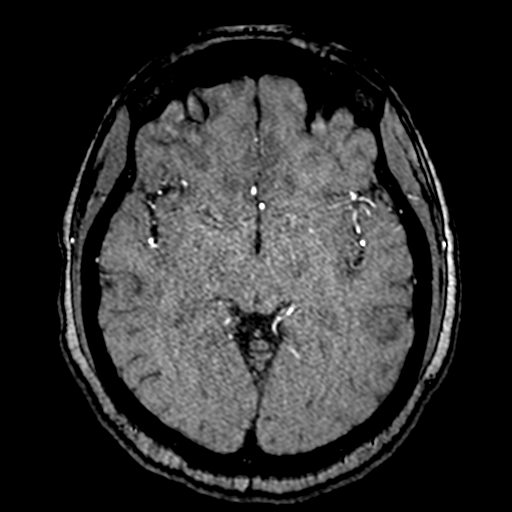
[im 132/160]
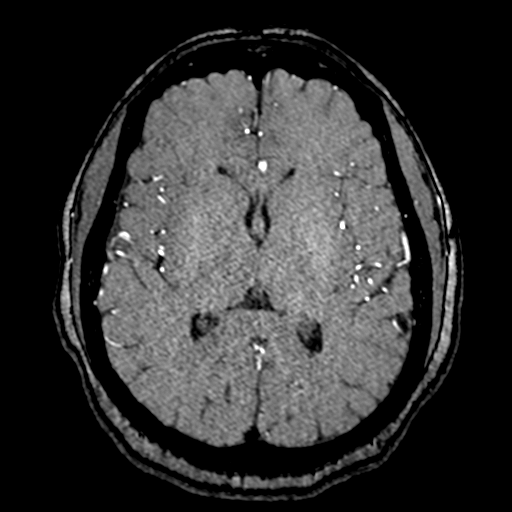
[im 136/160]
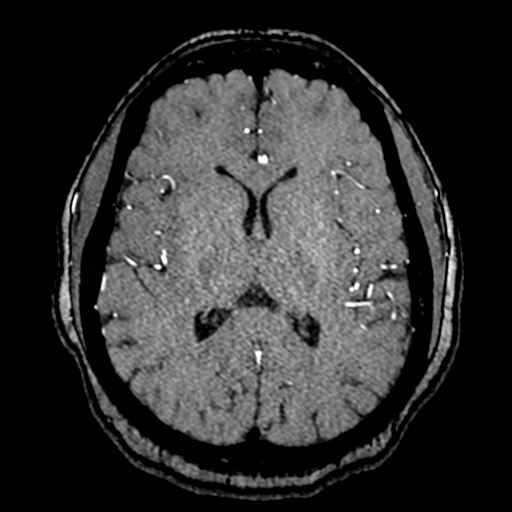
[im 153/160]
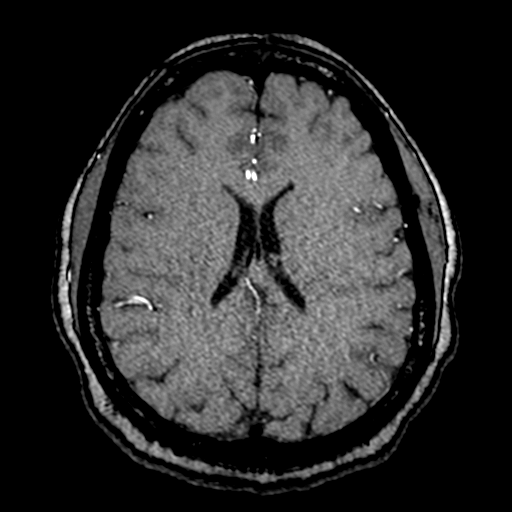

[19 of 48 positions shown; findings below may reference images not displayed]

FINDINGS: MRI HEAD FINDINGS

Brain: No acute infarct, mass effect or extra-axial collection. No
acute or chronic hemorrhage. Normal white matter signal, parenchymal
volume and CSF spaces. The midline structures are normal.

Vascular: Major flow voids are preserved.

Skull and upper cervical spine: Normal calvarium and skull base.
Visualized upper cervical spine and soft tissues are normal.

Sinuses/Orbits:No paranasal sinus fluid levels or advanced mucosal
thickening. No mastoid or middle ear effusion. Normal orbits.

MRA HEAD FINDINGS

POSTERIOR CIRCULATION:

--Vertebral arteries: Normal

--Inferior cerebellar arteries: Normal.

--Basilar artery: Normal.

--Superior cerebellar arteries: Normal.

--Posterior cerebral arteries: Normal.

ANTERIOR CIRCULATION:

--Intracranial internal carotid arteries: Normal.

--Anterior cerebral arteries (ACA): Normal.

--Middle cerebral arteries (MCA): Normal.

ANATOMIC VARIANTS: None
IMPRESSION: Normal MRI/MRA of the brain.

## 2021-07-30 IMAGING — MR MR HEAD W/O CM
10 of 11 series · 42 of 48 positions shown · non-contrast
Comparison: No pertinent prior exam.

CLINICAL DATA: Stroke follow-up.  Allergic reaction.

EXAM:
MRI HEAD WITHOUT CONTRAST
MRA HEAD WITHOUT CONTRAST
TECHNIQUE: Multiplanar, multi-echo pulse sequences of the brain and surrounding
structures were acquired without intravenous contrast. Angiographic
images of the Circle of Willis were acquired using MRA technique
without intravenous contrast.

[Series 5: DWI · axial · 3.0mm · 0.88mm/px · z∈[-90,+39]mm · 9 of 96 slices shown (1 of 4)]
[im 1/96]
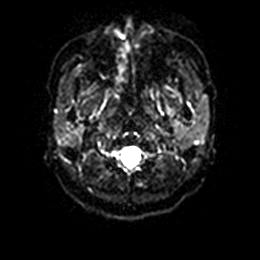
[im 12/96]
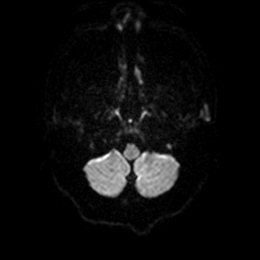
[im 24/96]
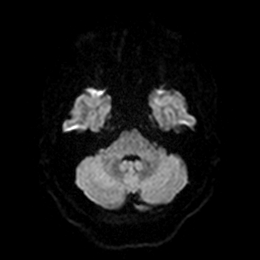
[im 36/96]
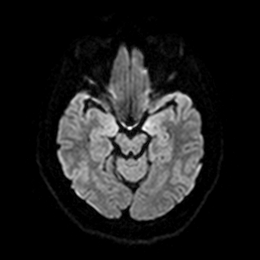
[im 48/96]
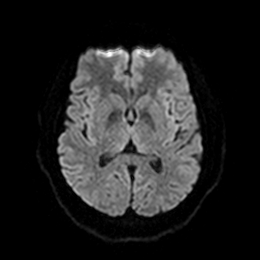
[im 60/96]
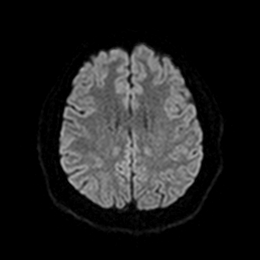
[im 72/96]
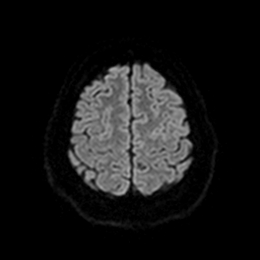
[im 84/96]
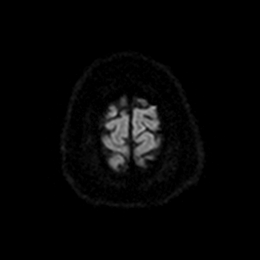
[im 96/96]
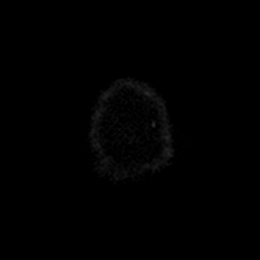

[Series 6: DWI · axial · 3.0mm · 0.88mm/px · z∈[-90,+39]mm · 4 of 48 slices shown (2 of 4)]
[im 1/48]
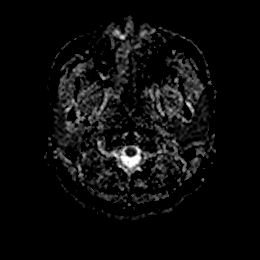
[im 16/48]
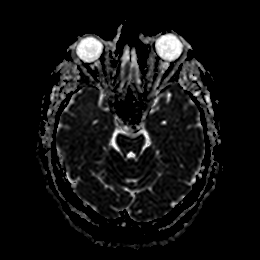
[im 32/48]
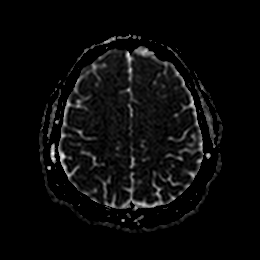
[im 48/48]
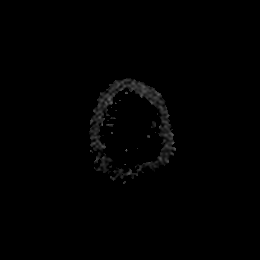

[Series 7: DWI · coronal · 4.0mm · 0.88mm/px · 6 of 64 slices shown (3 of 4)]
[im 1/64]
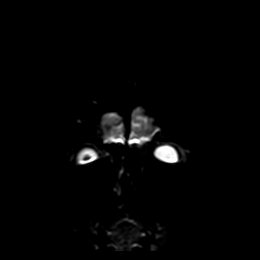
[im 13/64]
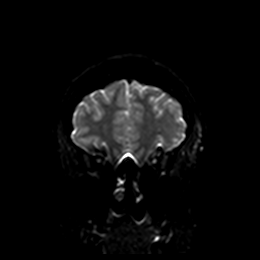
[im 26/64]
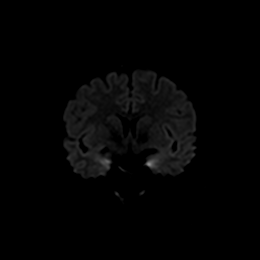
[im 38/64]
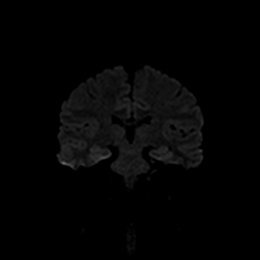
[im 51/64]
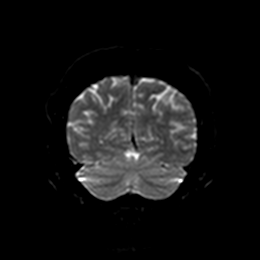
[im 64/64]
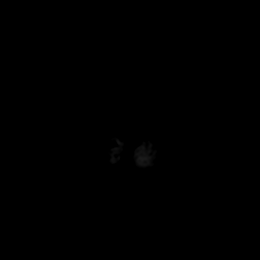

[Series 8: DWI · coronal · 4.0mm · 0.88mm/px · 3 of 32 slices shown (4 of 4)]
[im 1/32]
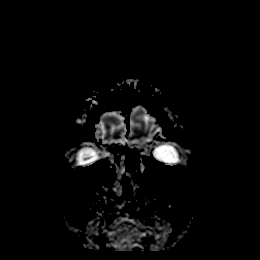
[im 16/32]
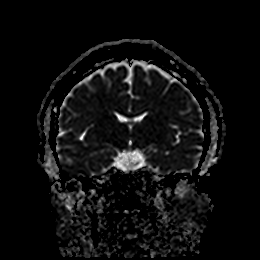
[im 32/32]
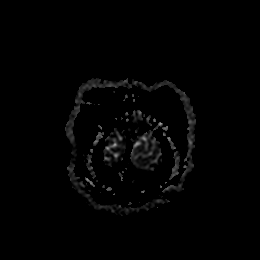

[Series 9: T1 · sagittal · 5.0mm · 0.75mm/px · 2 of 23 slices shown]
[im 1/23]
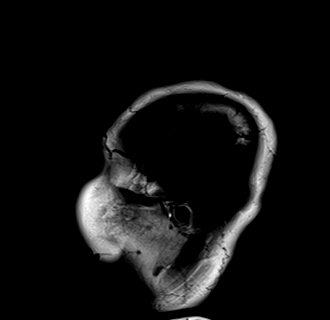
[im 23/23]
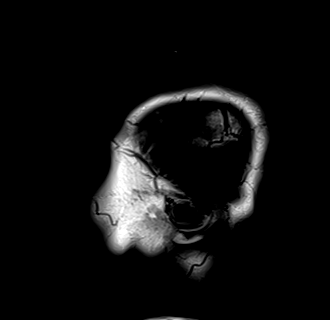

[Series 10: T2 · axial · 5.0mm · 0.72mm/px · z∈[-91,+40]mm · 2 of 25 slices shown (1 of 2)]
[im 1/25]
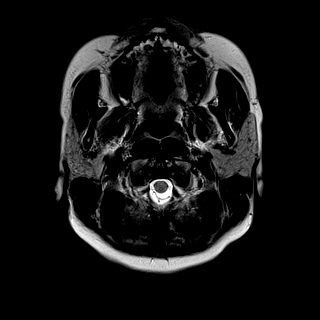
[im 25/25]
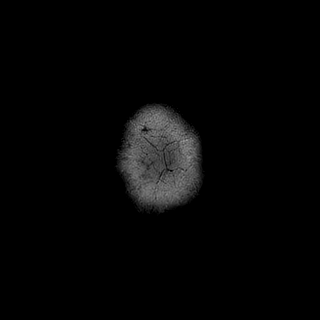

[Series 11: FLAIR · axial · 5.0mm · 0.45mm/px · z∈[-91,+41]mm · 2 of 25 slices shown]
[im 1/25]
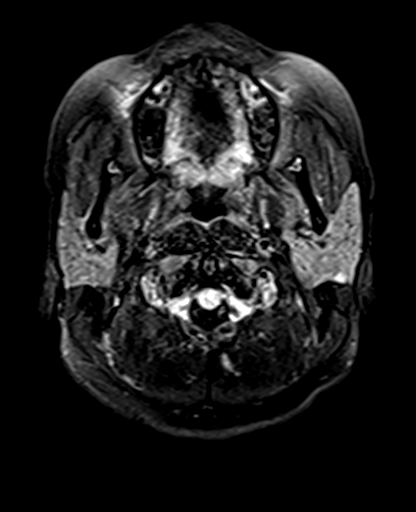
[im 25/25]
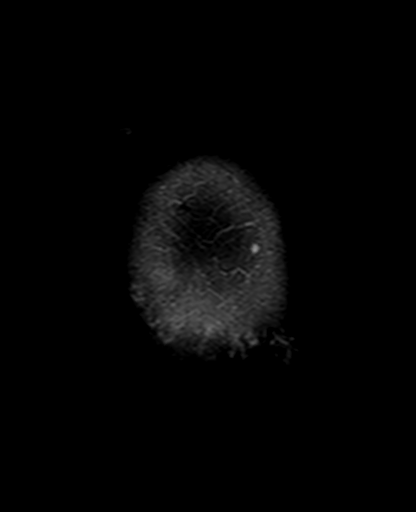

[Series 13: pha_images · axial · 3.0mm · 0.90mm/px · z∈[-106,+48]mm · 5 of 56 slices shown]
[im 1/56]
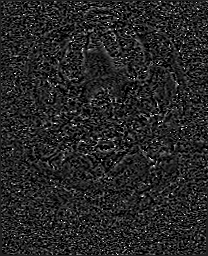
[im 14/56]
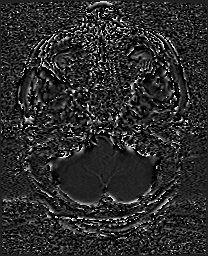
[im 28/56]
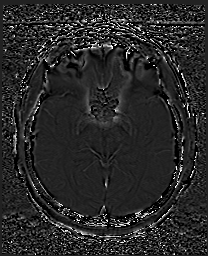
[im 42/56]
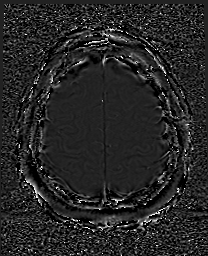
[im 56/56]
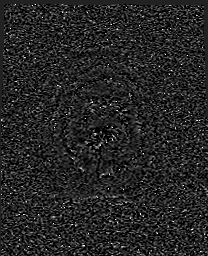

[Series 14: swi_images · axial · 3.0mm · 0.90mm/px · z∈[-106,+56]mm · 6 of 60 slices shown]
[im 1/60]
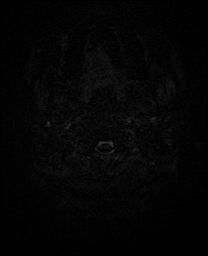
[im 12/60]
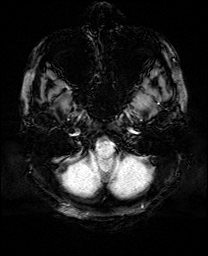
[im 24/60]
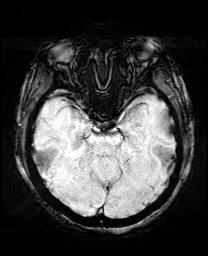
[im 36/60]
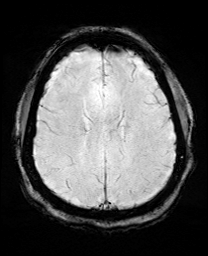
[im 48/60]
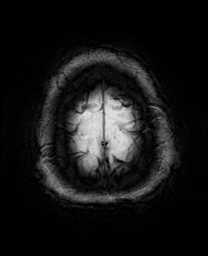
[im 60/60]
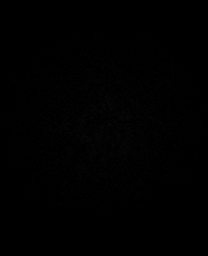

[Series 17: T2 · coronal · 5.0mm · 0.34mm/px · 3 of 29 slices shown (2 of 2)]
[im 1/29]
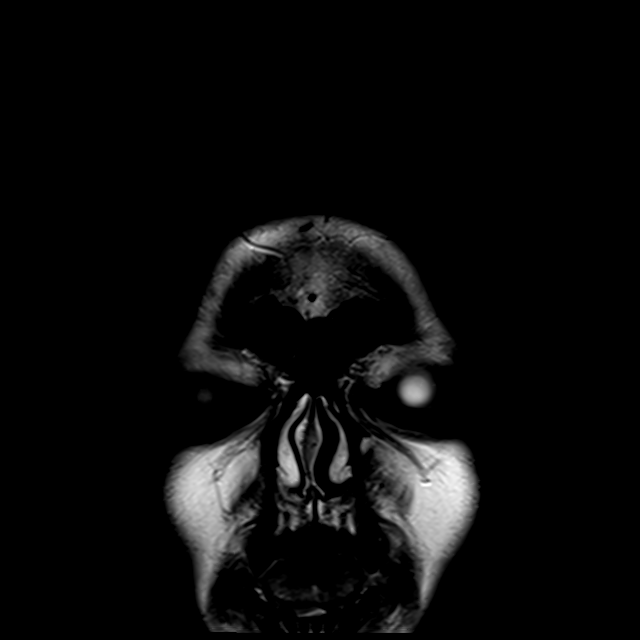
[im 15/29]
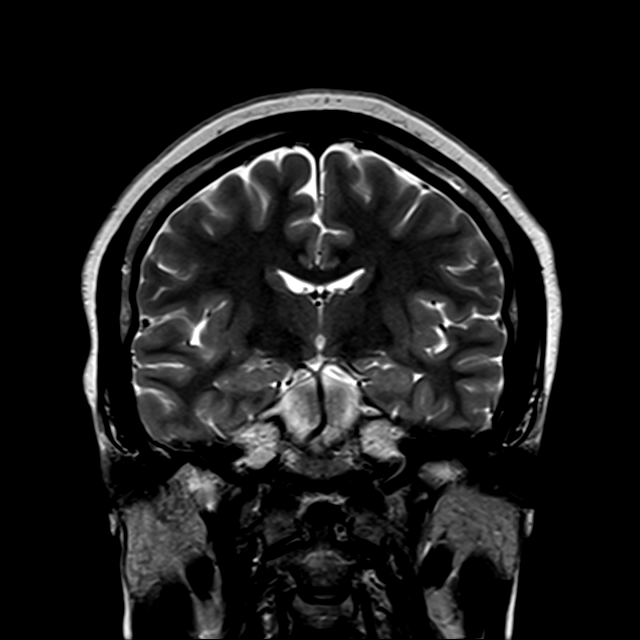
[im 29/29]
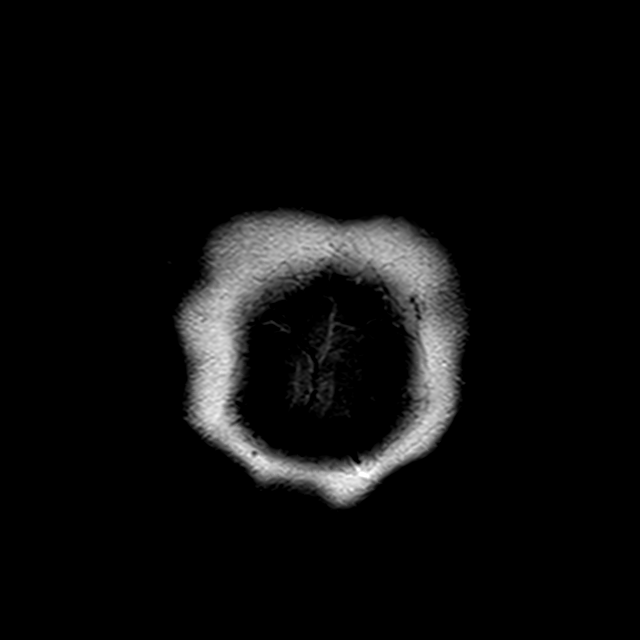

[42 of 48 positions shown; findings below may reference images not displayed]

FINDINGS: MRI HEAD FINDINGS

Brain: No acute infarct, mass effect or extra-axial collection. No
acute or chronic hemorrhage. Normal white matter signal, parenchymal
volume and CSF spaces. The midline structures are normal.

Vascular: Major flow voids are preserved.

Skull and upper cervical spine: Normal calvarium and skull base.
Visualized upper cervical spine and soft tissues are normal.

Sinuses/Orbits:No paranasal sinus fluid levels or advanced mucosal
thickening. No mastoid or middle ear effusion. Normal orbits.

MRA HEAD FINDINGS

POSTERIOR CIRCULATION:

--Vertebral arteries: Normal

--Inferior cerebellar arteries: Normal.

--Basilar artery: Normal.

--Superior cerebellar arteries: Normal.

--Posterior cerebral arteries: Normal.

ANTERIOR CIRCULATION:

--Intracranial internal carotid arteries: Normal.

--Anterior cerebral arteries (ACA): Normal.

--Middle cerebral arteries (MCA): Normal.

ANATOMIC VARIANTS: None
IMPRESSION: Normal MRI/MRA of the brain.

## 2021-07-30 IMAGING — CT CT HEAD W/O CM
3 series · 16 of 47 positions shown, 19 images · non-contrast
Comparison: [DATE] CT head, [DATE] MRI head

CLINICAL DATA: Stroke follow-up



[Series 4: head 5.0 h30s · axial · 0.40mm/px · z∈[-223,-98]mm · 10 of 30 slices shown, 13 images]
[im 3/30  brain]
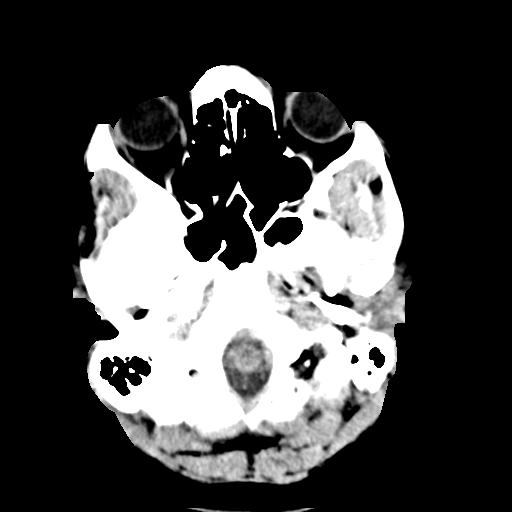
[im 3/30  bone]
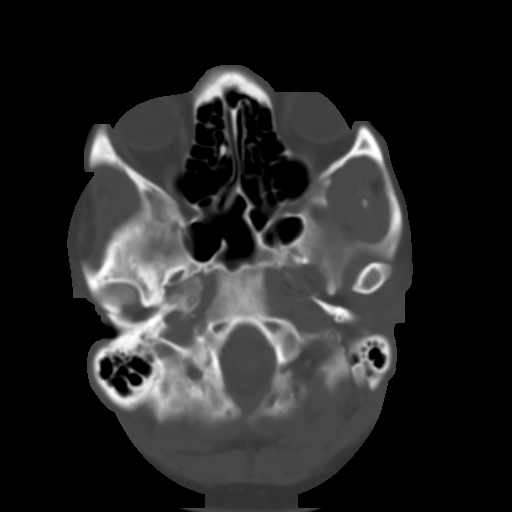
[im 6/30  brain]
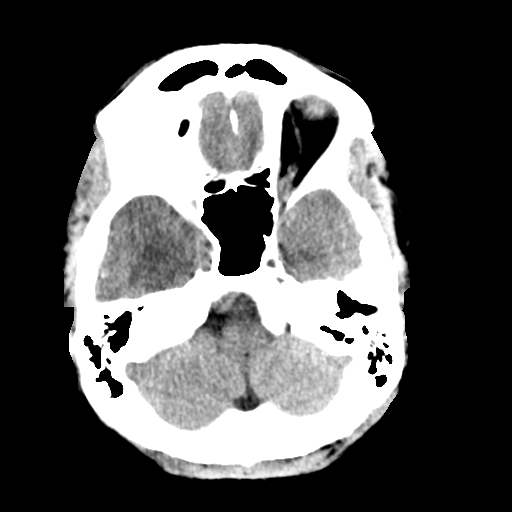
[im 9/30  brain]
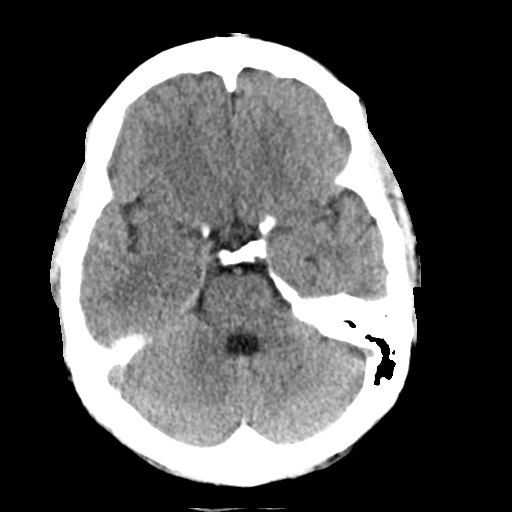
[im 11/30  brain]
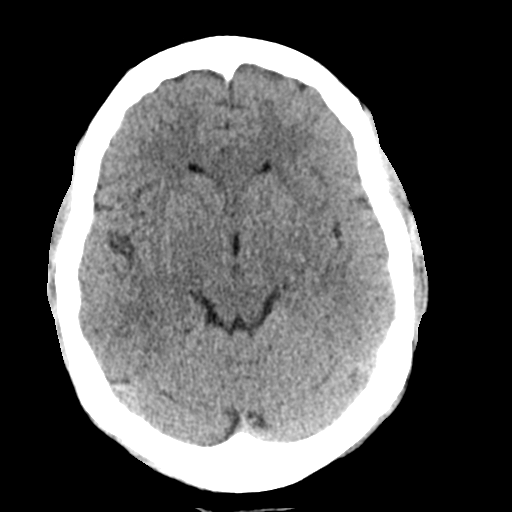
[im 14/30  brain]
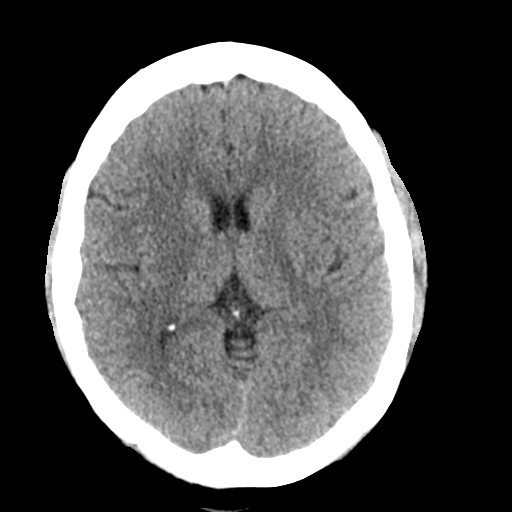
[im 14/30  bone]
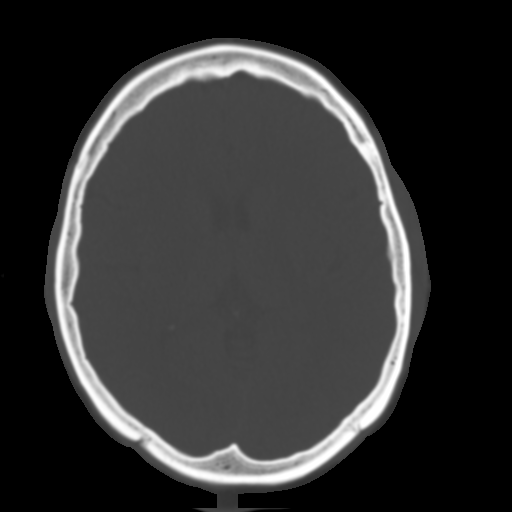
[im 17/30  brain]
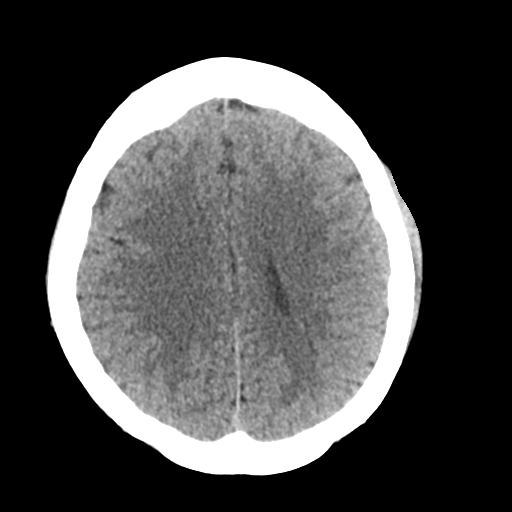
[im 20/30  brain]
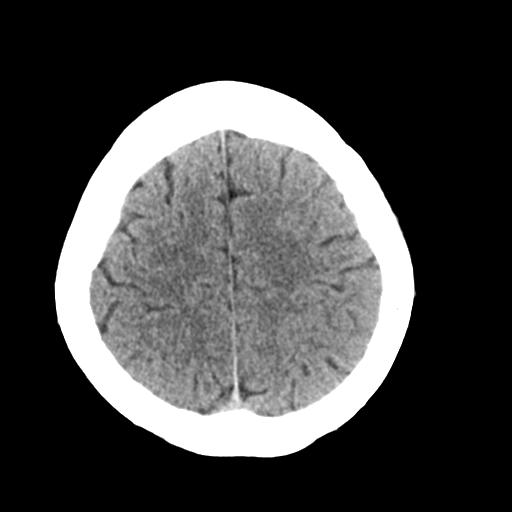
[im 23/30  brain]
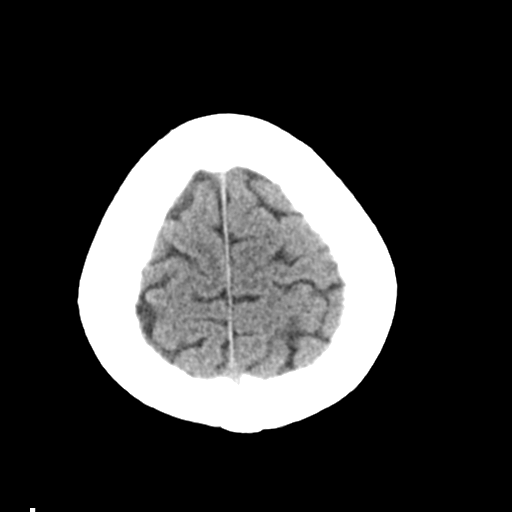
[im 25/30  brain]
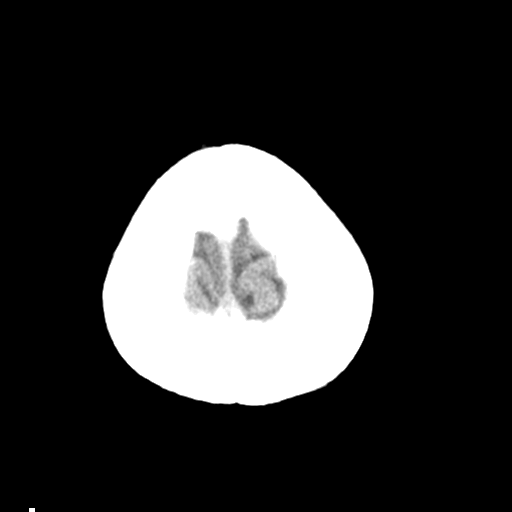
[im 25/30  bone]
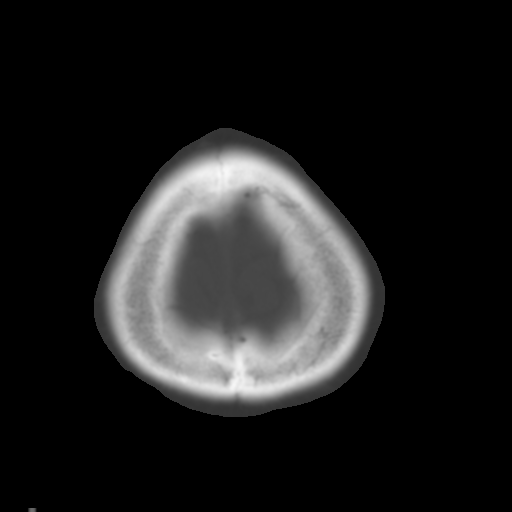
[im 28/30  brain]
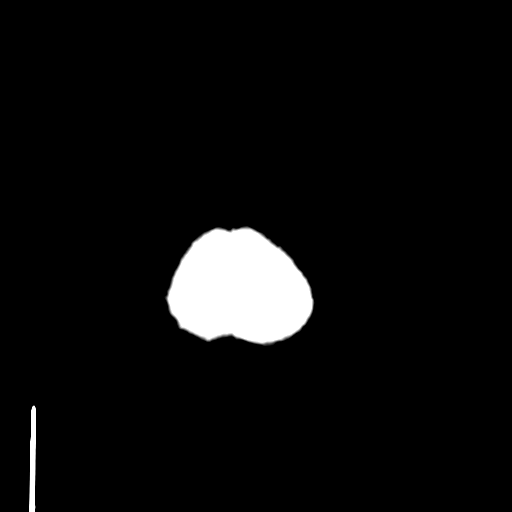

[Series 5: head 3.0 mpr cor · coronal · 0.33mm/px · 3 of 67 slices shown]
[im 23/67  brain]
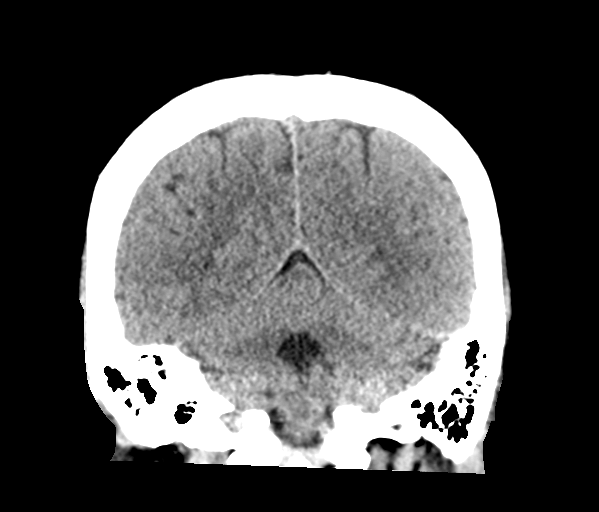
[im 30/67  brain]
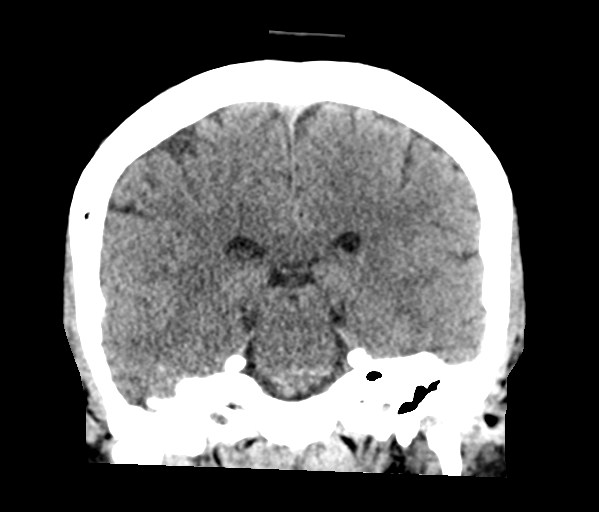
[im 37/67  brain]
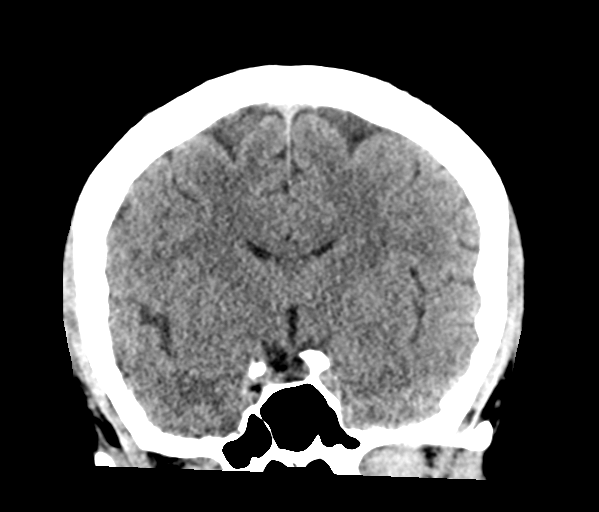

[Series 6: head 3.0 mpr sag · sagittal · 0.33mm/px · 3 of 66 slices shown]
[im 22/66  brain]
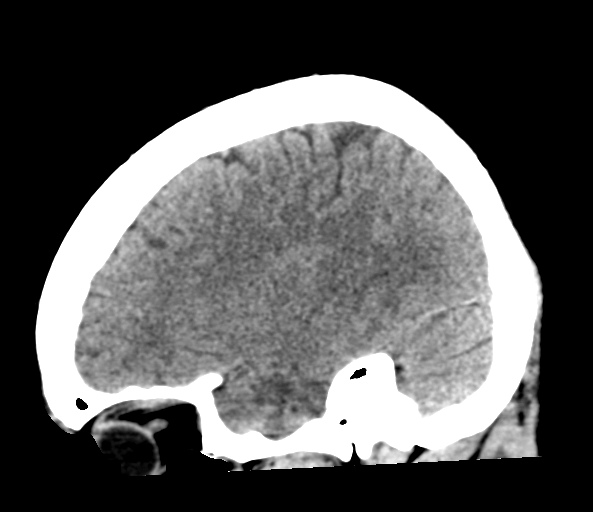
[im 33/66  brain]
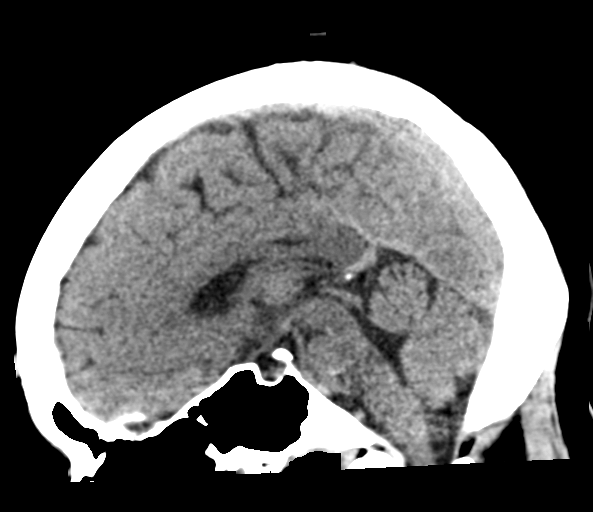
[im 44/66  brain]
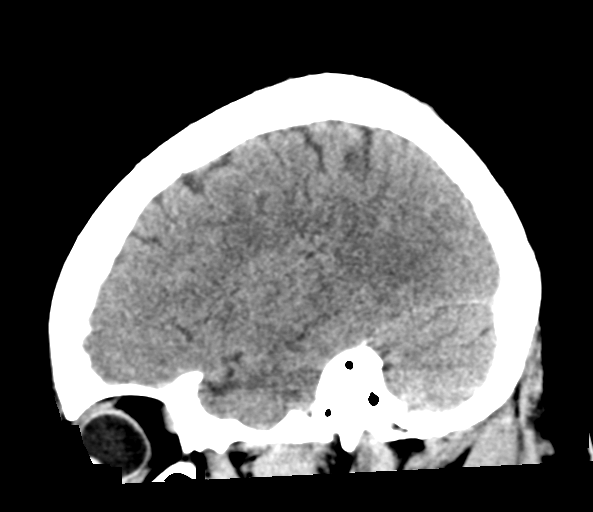

[16 of 47 positions shown; findings below may reference images not displayed]

FINDINGS: Brain: No evidence of acute infarction, hemorrhage, cerebral edema,
mass, mass effect, or midline shift. No hydrocephalus or extra-axial
fluid collection.

Vascular: No hyperdense vessel.

Skull: Normal. Negative for fracture or focal lesion.

Sinuses/Orbits: No acute finding.

Other: The mastoid air cells are well aerated.
IMPRESSION: IMPRESSION
No acute intracranial process.

## 2021-07-30 MED ORDER — CHLORHEXIDINE GLUCONATE CLOTH 2 % EX PADS
6.0000 | MEDICATED_PAD | Freq: Every day | CUTANEOUS | Status: DC
  Administered 2021-07-30 – 2021-07-31 (×2): 6 via TOPICAL

## 2021-07-30 MED ORDER — ACETAMINOPHEN 160 MG/5ML PO SOLN: 650.0000 mg | ORAL | Status: DC | PRN

## 2021-07-30 MED ORDER — STROKE: EARLY STAGES OF RECOVERY BOOK: Freq: Once | Status: AC

## 2021-07-30 MED ORDER — ATORVASTATIN CALCIUM 80 MG PO TABS
80.0000 mg | ORAL_TABLET | Freq: Every evening | ORAL | Status: DC
  Administered 2021-07-30: 80 mg via ORAL
  Filled 2021-07-30: qty 1

## 2021-07-30 MED ORDER — ACETAMINOPHEN 650 MG RE SUPP: 650.0000 mg | RECTAL | Status: DC | PRN

## 2021-07-30 MED ORDER — SODIUM CHLORIDE 0.9 % IV SOLN
50.0000 mL/h | INTRAVENOUS | Status: DC
  Administered 2021-07-30: 50 mL/h via INTRAVENOUS

## 2021-07-30 MED ORDER — ACETAMINOPHEN 325 MG PO TABS
650.0000 mg | ORAL_TABLET | ORAL | Status: DC | PRN
  Administered 2021-07-30: 650 mg via ORAL
  Filled 2021-07-30: qty 2

## 2021-07-30 MED ORDER — PANTOPRAZOLE SODIUM 40 MG IV SOLR
40.0000 mg | Freq: Every day | INTRAVENOUS | Status: DC
  Administered 2021-07-30 (×2): 40 mg via INTRAVENOUS
  Filled 2021-07-30 (×2): qty 10

## 2021-07-30 MED ORDER — PERFLUTREN LIPID MICROSPHERE
1.0000 mL | INTRAVENOUS | Status: AC | PRN
  Administered 2021-07-30: 2 mL via INTRAVENOUS
  Filled 2021-07-30: qty 10

## 2021-07-30 NOTE — Progress Notes (Signed)
Carotid duplex bilateral study completed.   Please see CV Proc for preliminary results.   Elizzie Westergard, RDMS, RVT  

## 2021-07-30 NOTE — Evaluation (Signed)
Occupational Therapy Evaluation ?Patient Details ?Name: [name] [name] ?MRN: [id number] ?DOB: 1980-06-18 ?Today's Date: 07/30/2021 ? ? ?History of Present Illness Pt is 41 yo female who presented to the hospital for an allergic reaction to seafood, received epi pen. Then she developed L side weakness and tingling and was given TNK 2139 4/23. MRI neg.  PMH: muscle spasms, HA, paresthesias L side, hemorrhagic ovarian cyst  ? ?Clinical Impression ?  ?Nyelle was evaluated s/p the above admission list. She is indep at baseline. She currently resides alone in a 1st floor hotel room and works as a Advertising copywriter for [employer]. She has limited support at d/c as her family lives in [place]. Upon evaluation pt was generally limited by pain (LLE, L shoulder, lower back), and decreased activity tolerance. Overall she required min G for ambulation and ADLs with RW. Pt would benefit from OT acutely. Recommend OP OT follow up to return to indep baseline. ?   ? ?Recommendations for follow up therapy are one component of a multi-disciplinary discharge planning process, led by the attending physician.  Recommendations may be updated based on patient status, additional functional criteria and insurance authorization.  ? ?Follow Up Recommendations ? Outpatient OT  ?  ?Assistance Recommended at Discharge Intermittent Supervision/Assistance  ?Patient can return home with the following A little help with walking and/or transfers;A little help with bathing/dressing/bathroom;Assist for transportation ? ?  ?Functional Status Assessment ? Patient has had a recent decline in their functional status and demonstrates the ability to make significant improvements in function in a reasonable and predictable amount of time.  ?Equipment Recommendations ? Other (comment);Tub/shower seat (RW)  ?  ?   ?Precautions / Restrictions Precautions ?Precautions: Fall ?Restrictions ?Weight Bearing Restrictions: No  ? ?  ? ?Mobility Bed Mobility ?Overal bed  mobility: Needs Assistance ?Bed Mobility: Supine to Sit ?  ?  ?Supine to sit: Min guard ?  ?  ?General bed mobility comments: increased time and effort ?  ? ?Transfers ?Overall transfer level: Needs assistance ?Equipment used: Rolling walker (2 wheels) ?Transfers: Sit to/from Stand ?Sit to Stand: Min guard, +2 safety/equipment ?  ?  ?  ?  ?  ?General transfer comment: no physical assist, pt unsteady upon standing ?  ? ?  ?Balance Overall balance assessment: Needs assistance ?Sitting-balance support: Feet supported ?Sitting balance-Leahy Scale: Fair ?  ?  ?Standing balance support: Single extremity supported, During functional activity ?Standing balance-Leahy Scale: Fair ?  ?  ?  ?  ?  ?   ? ?ADL either performed or assessed with clinical judgement  ? ?ADL Overall ADL's : Needs assistance/impaired ?Eating/Feeding: Independent;Sitting ?  ?Grooming: Min guard;Standing ?  ?Upper Body Bathing: Set up;Sitting ?  ?Lower Body Bathing: Minimal assistance;Sit to/from stand ?  ?Upper Body Dressing : Set up;Sitting ?  ?Lower Body Dressing: Minimal assistance;Sit to/from stand ?  ?Toilet Transfer: Min guard;Ambulation;Rolling walker (2 wheels) ?  ?Toileting- Clothing Manipulation and Hygiene: Supervision/safety;Sit to/from stand ?  ?  ?  ?Functional mobility during ADLs: Min guard ?General ADL Comments: required increased time for all tasks, slow gait, decreased activity tolerance, limping due to LLE pain  ? ? ? ?Vision Baseline Vision/History: 0 No visual deficits ?Vision Assessment?: No apparent visual deficits  ?   ?   ?   ? ?Pertinent Vitals/Pain Pain Assessment ?Pain Assessment: 0-10 ?Pain Score: 8  ?Pain Location: L hip/ thigh ?Pain Descriptors / Indicators: Spasm ?Pain Intervention(s): Limited activity within patient's tolerance  ? ? ? ?Hand  Dominance Right ?  ?Extremity/Trunk Assessment Upper Extremity Assessment ?Upper Extremity Assessment: LUE deficits/detail ?LUE Deficits / Details: painful shoulder AROM,  baseline. ?LUE Sensation: WNL ?LUE Coordination: decreased fine motor;decreased gross motor ?  ?Lower Extremity Assessment ?Lower Extremity Assessment: Defer to PT evaluation ?  ?Cervical / Trunk Assessment ?Cervical / Trunk Assessment: Normal ?  ?Communication Communication ?Communication: No difficulties ?  ?Cognition Arousal/Alertness: Awake/alert ?Behavior During Therapy: Anxious ?Overall Cognitive Status: Within Functional Limits for tasks assessed ?  ?  ?  ?  ?  ?  ?General Comments  VSS on RA ? ?  ?   ?   ? ? ?Home Living Family/patient expects to be discharged to:: Other (Comment) [facility]) ?Living Arrangements: Alone ?  ?Type of Home: Other(Comment) (1st floor hotel) ?Home Access: Level entry ?  ?  ?Home Layout: One level ?  ?  ?Bathroom Shower/Tub: Tub/shower unit;Curtain ?  ?Bathroom Toilet: Standard ?  ?  ?Home Equipment: None ?  ?Additional Comments: lives in a hotel. 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 Has 2 daughters, 79 and 29, who are still in [place] with pt's 42 yo goddaughter ?  ? ?  ?Prior Functioning/Environment Prior Level of Function : Driving;Working/employed;Independent/Modified Independent ?  ?  ?  ?  ?  ?  ?Mobility Comments: no AD ?ADLs Comments: indep ?  ? ?  ?  ?OT Problem List: Decreased strength;Decreased range of motion;Decreased activity tolerance;Impaired balance (sitting and/or standing);Decreased safety awareness;Decreased knowledge of use of DME or AE;Decreased knowledge of precautions;Pain ?  ?   ?OT Treatment/Interventions: Self-care/ADL training;Therapeutic exercise;DME and/or AE instruction;Therapeutic activities;Patient/family education;Balance training  ?  ?OT Goals(Current goals can be found in the care plan section) Acute Rehab OT Goals ?Patient Stated Goal: back to indep ?OT Goal Formulation: With patient ?Time For Goal Achievement: 08/13/21 ?Potential to Achieve Goals: Good ?ADL Goals ?Pt Will Perform Upper Body Dressing:  Independently;standing ?Pt Will Perform Lower Body Dressing: Independently;sit to/from stand ?Pt Will Transfer to Toilet: Independently ?Additional ADL Goal #1: Pt will demonstrate increased activity tolerance to independently complete at least ADLs in standing  ?OT Frequency: Min 2X/week ?  ? ?Co-evaluation PT/OT/SLP Co-Evaluation/Treatment: Yes ?Reason for Co-Treatment: Complexity of the patient's impairments (multi-system involvement);To address functional/ADL transfers ?  ?OT goals addressed during session: ADL's and self-care ?  ? ?  ?AM-PAC OT "6 Clicks" Daily Activity     ?Outcome Measure Help from another person eating meals?: None ?Help from another person taking care of personal grooming?: A Little ?Help from another person toileting, which includes using toliet, bedpan, or urinal?: A Little ?Help from another person bathing (including washing, rinsing, drying)?: A Little ?Help from another person to put on and taking off regular upper body clothing?: A Little ?Help from another person to put on and taking off regular lower body clothing?: A Little ?6 Click Score: 19 ?  ?End of Session Equipment Utilized During Treatment: Rolling walker (2 wheels) ?Nurse Communication: Mobility status ? ?Activity Tolerance: Patient tolerated treatment well ?Patient left: in chair;with call bell/phone within reach ? ?OT Visit Diagnosis: Unsteadiness on feet (R26.81);Other abnormalities of gait and mobility (R26.89);Muscle weakness (generalized) (M62.81);Pain  ?              ?Time: 2956-2130 ?OT Time Calculation (min): 44 min ?Charges:  OT General Charges ?$OT Visit: 1 Visit ?OT Evaluation ?$OT Eval Moderate Complexity: 1 Mod ?OT Treatments ?$Self Care/Home Management : 8-22 mins ? ?Rihanna Marseille A Nichols Corter ?07/30/2021, 1:35  PM ?

## 2021-07-30 NOTE — TOC CAGE-AID Note (Signed)
Transition of Care (TOC) - CAGE-AID Screening ? ? ?Patient Details  ?Name: [name] [name] ?MRN: [id number] ?Date of Birth: 29-May-1980 ? ?Transition of Care (TOC) CM/SW Contact:    ?Heyward Douthit C Tarpley-Carter, LCSWA ?Phone Number: ?07/30/2021, 1:32 PM ? ? ?Clinical Narrative: ?Pt is unable to participate in Cage Aid. ?CSW will assess at a better time. ? ?Passenger transport manager, MSW, LCSW-A ?Pronouns:  She/Her/Hers ?[facility] ?Clinical Social Worker ?Direct Number:  [phone] ?[email or web address] ? ? ? ?CAGE-AID Screening: ?Substance Abuse Screening unable to be completed due to: : Patient unable to participate ? ?  ?  ?  ?  ?  ? ?Substance Abuse Education Offered: No ? ?  ? ? ? ? ? ? ?

## 2021-07-30 NOTE — Progress Notes (Signed)
Physical Therapy Treatment ?Patient Details ?Name: [name] [name] ?MRN: [id number] ?DOB: March 05, 1981 ?Today's Date: 07/30/2021 ? ? ?History of Present Illness Pt is 41 yo female who presented to the hospital for an allergic reaction to seafood, received epi pen. Then she developed L side weakness and tingling and was given TNK 2139 4/23. MRI neg.  PMH: muscle spasms, HA, paresthesias L side, hemorrhagic ovarian cyst ? ?  ?PT Comments  ? ? Pt admitted with above diagnosis. Pt has been living independently at a hotel in [place] and working at [employer] in housekeeping. Her daughters are in [place] where she moved from and she is hoping to bring them here soon. She states that she moved to be able to make a better income to support her daughters. Pt reports L thigh pain that is familiar to her as she has spasms of LLE. She also had L shoulder discomfort. Pt very unsteady with initial standing and used RW for increased stability. As pt ambulated she was able to out RW aside and ambulate without AD however, gait was slow and guarded with decreased ability to correct for perturbations. Discussed who could possibly come stay with her short term and recommend outpt PT to continue to work on balance and reaction time.  Pt currently with functional limitations due to the deficits listed below (see PT Problem List). Pt will benefit from skilled PT to increase their independence and safety with mobility to allow discharge to the venue listed below.   ?   ?Recommendations for follow up therapy are one component of a multi-disciplinary discharge planning process, led by the attending physician.  Recommendations may be updated based on patient status, additional functional criteria and insurance authorization. ? ?Follow Up Recommendations ? Outpatient PT ?  ?  ?Assistance Recommended at Discharge Intermittent Supervision/Assistance  ?Patient can return home with the following Assist for transportation;Direct  supervision/assist for financial management;Direct supervision/assist for medications management;Assistance with feeding;Assistance with cooking/housework ?  ?Equipment Recommendations ? Other (comment) (TBD)  ?  ?Recommendations for Other Services   ? ? ?  ?Precautions / Restrictions Precautions ?Precautions: Fall ?Restrictions ?Weight Bearing Restrictions: No  ?  ? ?Mobility ? Bed Mobility ?Overal bed mobility: Needs Assistance ?Bed Mobility: Supine to Sit ?  ?  ?Supine to sit: Min guard ?  ?  ?General bed mobility comments: increased time and effort ?  ? ?Transfers ?Overall transfer level: Needs assistance ?Equipment used: Rolling walker (2 wheels) ?Transfers: Sit to/from Stand ?Sit to Stand: Min guard, +2 safety/equipment ?  ?  ?  ?  ?  ?General transfer comment: no physical assist, pt unsteady upon standing ?  ? ?Ambulation/Gait ?Ambulation/Gait assistance: +2 safety/equipment, Min assist ?Gait Distance (Feet): 80 Feet ?Assistive device: Rolling walker (2 wheels), None ?Gait Pattern/deviations: Step-through pattern, Decreased stride length, Decreased weight shift to left ?Gait velocity: decreased ?Gait velocity interpretation: <1.8 ft/sec, indicate of risk for recurrent falls ?  ?General Gait Details: pt very unsteady with initial standing so RW used for support. After 44' RW placed away and pt continued to ambulate, still unsteady but no worse than with RW ? ? ?Stairs ?  ?  ?  ?  ?  ? ? ?Wheelchair Mobility ?  ? ?Modified Rankin (Stroke Patients Only) ?  ? ? ?  ?Balance Overall balance assessment: Needs assistance ?Sitting-balance support: Feet supported ?Sitting balance-Leahy Scale: Fair ?  ?  ?Standing balance support: Single extremity supported, During functional activity ?Standing balance-Leahy Scale: Fair ?Standing balance comment: pt  able to stand and bend fwd for perineal care without LOB but with wide BOS, more unsteady in standing in narrow stance ?  ?  ?  ?  ?  ?  ?  ?  ?  ?  ?  ?  ? ?  ?Cognition  Arousal/Alertness: Awake/alert ?Behavior During Therapy: Anxious ?Overall Cognitive Status: Within Functional Limits for tasks assessed ?  ?  ?  ?  ?  ?  ?  ?  ?  ?  ?  ?  ?  ?  ?  ?  ?  ?  ?  ? ?  ?Exercises   ? ?  ?General Comments General comments (skin integrity, edema, etc.): VSS on RA. ?  ?  ? ?Pertinent Vitals/Pain Pain Assessment ?Pain Assessment: 0-10 ?Pain Score: 8  ?Pain Location: L hip/ thigh and R sided back with ambulation, L shoulder ?Pain Descriptors / Indicators: Spasm ?Pain Intervention(s): Limited activity within patient's tolerance, Monitored during session  ? ? ?Home Living Family/patient expects to be discharged to:: Other (Comment) [facility]) ?Living Arrangements: Alone ?  ?Type of Home: Other(Comment) (1st floor hotel) ?Home Access: Level entry ?  ?  ?  ?Home Layout: One level ?Home Equipment: None ?Additional Comments: lives in a hotel. 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 Has 2 daughters, 32 and 38, who are still in [place] with pt's 42 yo goddaughter  ?  ?Prior Function    ?  ?  ?   ? ?PT Goals (current goals can now be found in the care plan section) Acute Rehab PT Goals ?Patient Stated Goal: return to work ?PT Goal Formulation: With patient ?Time For Goal Achievement: 08/13/21 ?Potential to Achieve Goals: Good ? ?  ?Frequency ? ? ? Min 3X/week ? ? ? ?  ?PT Plan    ? ? ?Co-evaluation PT/OT/SLP Co-Evaluation/Treatment: Yes ?Reason for Co-Treatment: Complexity of the patient's impairments (multi-system involvement);Necessary to address cognition/behavior during functional activity ?PT goals addressed during session: Mobility/safety with mobility;Balance;Proper use of DME ?OT goals addressed during session: ADL's and self-care ?  ? ?  ?AM-PAC PT "6 Clicks" Mobility   ?Outcome Measure ? Help needed turning from your back to your side while in a flat bed without using bedrails?: None ?Help needed moving from lying on your back to sitting on the side of  a flat bed without using bedrails?: None ?Help needed moving to and from a bed to a chair (including a wheelchair)?: A Little ?Help needed standing up from a chair using your arms (e.g., wheelchair or bedside chair)?: A Little ?Help needed to walk in hospital room?: A Little ?Help needed climbing 3-5 steps with a railing? : A Lot ?6 Click Score: 19 ? ?  ?End of Session Equipment Utilized During Treatment: Gait belt ?Activity Tolerance: Patient tolerated treatment well ?Patient left: in chair;with call bell/phone within reach ?Nurse Communication: Mobility status ?PT Visit Diagnosis: Unsteadiness on feet (R26.81);Difficulty in walking, not elsewhere classified (R26.2) ?  ? ? ?Time: 5053-9767 ?PT Time Calculation (min) (ACUTE ONLY): 41 min ? ?Charges:             ?          ? ?Lyanne Co, PT  ?Acute Rehab Services ? Pager [phone] ?Office [phone] ? ? ? ?Nichlas Pitera L Romon Devereux ?07/30/2021, 2:13 PM ? ?

## 2021-07-30 NOTE — H&P (Signed)
Neurology H&P ? ?CC: Left-sided weakness ? ?History is obtained from:patient ? ?HPI: [name] [name] is a 41 y.o. female with a history of "muscle spasms" for which she takes amitriptyline.  She also complains of occasional paresthesias on the left side in the past and headaches.  She came into the emergency department for an allergic reaction to seafood, was given EpiPen prior to arrival.  She states that she is doing better from this standpoint.  She states that she had a headache earlier tonight, then she noticed that her left side was tingly and weak.  She was evaluated by teleneurology as a code stroke, who administered IV tenecteplase. ? ? ?LKW: 8:30 PM ?tpa given?:  This yes ?Modified Rankin Scale: 0-Completely asymptomatic and back to baseline post- stroke ?NIHSS: 2 ? ? ?ROS: A complete ROS was performed and is negative except as noted in the HPI.  ?Past Medical History:  ?Diagnosis Date  ? Allergic reaction   ? ? ? ?History reviewed. No pertinent family history. ? ? ?Social History:  reports that she has never smoked. She has never used smokeless tobacco. She reports that she does not currently use alcohol. She reports that she does not currently use drugs. ? ? ?Prior to Admission medications   ?Medication Sig Start Date End Date Taking? Authorizing Provider  ?amitriptyline (ELAVIL) 50 MG tablet Take 50 mg by mouth at bedtime.   Yes [provider]  ?OVER THE COUNTER MEDICATION Take 1 tablet by mouth daily. Nitro-Oxygen Booster   Yes [provider]  ?Turmeric 500 MG CAPS Take 1 capsule by mouth daily.   Yes [provider]  ? ? ? ?Exam: ?Current vital signs: ?BP 114/67   Pulse 78   Temp 98.3 ?F (36.8 ?C) (Oral)   Resp (!) 22   Ht 5\' 2"  (1.575 m)   Wt 85.3 kg   SpO2 97%   BMI 34.40 kg/m?  ? ? ?Physical Exam  ?Constitutional: Appears well-developed and well-nourished.  ?Psych: Affect appropriate to situation ?Eyes: No scleral injection ?HENT: No OP obstrucion ?Head:  Normocephalic.  ?Cardiovascular: Normal rate and regular rhythm.  ?Respiratory: Effort normal and breath sounds normal to anterior ascultation ?GI: Soft.  No distension. There is no tenderness.  ?Skin: WDI ? ?Neuro: ?Mental Status: ?Patient is awake, alert, oriented to person, place, month, year, and situation. ?Patient is able to give a clear and coherent history. ?No signs of aphasia or neglect ?Cranial Nerves: ?II: Visual Fields are full. Pupils are equal, round, and reactive to light.   ?III,IV, VI: EOMI without ptosis or diploplia.  ?V: Facial sensation is symmetric to temperature ?VII: Facial movement is symmetric.  ?VIII: hearing is intact to voice ?X: Uvula elevates symmetrically ?XI: Shoulder shrug is symmetric. ?XII: tongue is midline without atrophy or fasciculations.  ?Motor: ?Tone is normal. Bulk is normal. 5/5 strength was present on the right, 4/5 inconsistnet weakness in the left arm and leg.  ?Sensory: ?Sensation is symmetric to light touch and temperature in the arms and legs. ?Cerebellar: ?FNF intact bilaterally ? ? ?I have reviewed labs in epic and the pertinent results are: ?Cr 0.92 ? ?I have reviewed the images obtained: CT head- negative ? ?Primary Diagnosis:  ?Left hemiparesis ? ? ?Impression: 42 year old female who presents with left hemiparesis and was given IV tenecteplase. Her exam has some inconsistency, and I think there eis significant possibility of a conversion reaction, but agree that exam suggested ischemic infarct on initial assessment. I will get MRI, though  if it is negative, I would favor considering this more likley a stroke mimic than ischemic event.   ? ?Plan: ?- HgbA1c, fasting lipid panel ?- MRI of the brain without contrast ?- Frequent neuro checks ?- Echocardiogram ?- MRA head and carotid doppler ?- Prophylactic therapy-none for 24 hours.  ?- Risk factor modification ?- Telemetry monitoring ?- PT consult, OT consult, Speech consult ?- Stroke team to follow ? ? ?Roland Rack, MD ?[facility] ?[phone] ? ?If 7pm- 7am, please page neurology on call as listed in [place]. ? ?

## 2021-07-30 NOTE — Progress Notes (Signed)
OT Cancellation Note ? ?Patient Details ?Name: [name] [name] ?MRN: [id number] ?DOB: 1981/03/30 ? ? ?Cancelled Treatment:    Reason Eval/Treat Not Completed: Active bedrest order (OT evaluation to f/u when activity orders are updated.) ? ?Gwyneth Fernandez A Josette Shimabukuro ?07/30/2021, 7:35 AM ?

## 2021-07-30 NOTE — Progress Notes (Signed)
PT Cancellation Note ? ?Patient Details ?Name: [name] [name] ?MRN: [id number] ?DOB: [birth year] ? ? ?Cancelled Treatment:    Reason Eval/Treat Not Completed: Active bedrest order. Will follow as activity orders advanced.  ? ?Lyanne Co, PT  ?Acute Rehab Services ? Pager [phone] ?Office [phone] ? ? ? ?Heidi Lemay L Vester Titsworth ?07/30/2021, 9:24 AM ?

## 2021-07-30 NOTE — Progress Notes (Addendum)
STROKE TEAM PROGRESS NOTE  ? ?INTERVAL HISTORY ?Patient is seen in her room with no family at the bedside.  She was admitted yesterday after having an allergic reaction to smell of seafood for which she required use of an Epi-pen.  While in the ED, she experienced weakness and tingling of her left side along with a headache.  She was seen as a code stroke by telemetry neurologist and given TNK.  Her symptoms have now resolved, and MRI does not show a stroke. ? ?Vitals:  ? 07/30/21 0900 07/30/21 1000 07/30/21 1100 07/30/21 1200  ?BP: 125/81 (!) 125/102 111/61 101/65  ?Pulse: 85 84 84 89  ?Resp: 18 17 19 18   ?Temp:    97.9 ?F (36.6 ?C)  ?TempSrc:    Oral  ?SpO2: 96% 99% 96% 97%  ?Weight:      ?Height:      ? ?CBC:  ?Recent Labs  ?Lab 07/29/21 ?2110  ?WBC 10.8*  ?NEUTROABS 9.1*  ?HGB 14.3  ?HCT 41.7  ?MCV 91.9  ?PLT 304  ? ?Basic Metabolic Panel:  ?Recent Labs  ?Lab 07/29/21 ?2110  ?NA 138  ?K 3.9  ?CL 108  ?CO2 23  ?GLUCOSE 107*  ?BUN 7  ?CREATININE 0.92  ?CALCIUM 9.3  ? ?Lipid Panel:  ?Recent Labs  ?Lab 07/30/21 ?0453  ?CHOL 208*  ?TRIG 40  ?HDL 52  ?CHOLHDL 4.0  ?VLDL 8  ?LDLCALC 148*  ? ?HgbA1c:  ?Recent Labs  ?Lab 07/30/21 ?0453  ?HGBA1C 5.3  ? ?Urine Drug Screen: No results for input(s): LABOPIA, COCAINSCRNUR, LABBENZ, AMPHETMU, THCU, LABBARB in the last 168 hours.  ?Alcohol Level  ?Recent Labs  ?Lab 07/29/21 ?2110  ?ETH <10  ? ? ?IMAGING past 24 hours ?MR ANGIO HEAD WO CONTRAST ? ?Result Date: 07/30/2021 ?CLINICAL DATA:  Stroke follow-up.  Allergic reaction. EXAM: MRI HEAD WITHOUT CONTRAST MRA HEAD WITHOUT CONTRAST TECHNIQUE: Multiplanar, multi-echo pulse sequences of the brain and surrounding structures were acquired without intravenous contrast. Angiographic images of the Circle of Willis were acquired using MRA technique without intravenous contrast. COMPARISON:  No pertinent prior exam. FINDINGS: MRI HEAD FINDINGS Brain: No acute infarct, mass effect or extra-axial collection. No acute or chronic hemorrhage.  Normal white matter signal, parenchymal volume and CSF spaces. The midline structures are normal. Vascular: Major flow voids are preserved. Skull and upper cervical spine: Normal calvarium and skull base. Visualized upper cervical spine and soft tissues are normal. Sinuses/Orbits:No paranasal sinus fluid levels or advanced mucosal thickening. No mastoid or middle ear effusion. Normal orbits. MRA HEAD FINDINGS POSTERIOR CIRCULATION: --Vertebral arteries: Normal --Inferior cerebellar arteries: Normal. --Basilar artery: Normal. --Superior cerebellar arteries: Normal. --Posterior cerebral arteries: Normal. ANTERIOR CIRCULATION: --Intracranial internal carotid arteries: Normal. --Anterior cerebral arteries (ACA): Normal. --Middle cerebral arteries (MCA): Normal. ANATOMIC VARIANTS: None IMPRESSION: Normal MRI/MRA of the brain. Electronically Signed   By: Deatra RobinsonKevin  Herman M.D.   On: 07/30/2021 02:36  ? ?MR BRAIN WO CONTRAST ? ?Result Date: 07/30/2021 ?CLINICAL DATA:  Stroke follow-up.  Allergic reaction. EXAM: MRI HEAD WITHOUT CONTRAST MRA HEAD WITHOUT CONTRAST TECHNIQUE: Multiplanar, multi-echo pulse sequences of the brain and surrounding structures were acquired without intravenous contrast. Angiographic images of the Circle of Willis were acquired using MRA technique without intravenous contrast. COMPARISON:  No pertinent prior exam. FINDINGS: MRI HEAD FINDINGS Brain: No acute infarct, mass effect or extra-axial collection. No acute or chronic hemorrhage. Normal white matter signal, parenchymal volume and CSF spaces. The midline structures are normal. Vascular: Major flow voids are preserved.  Skull and upper cervical spine: Normal calvarium and skull base. Visualized upper cervical spine and soft tissues are normal. Sinuses/Orbits:No paranasal sinus fluid levels or advanced mucosal thickening. No mastoid or middle ear effusion. Normal orbits. MRA HEAD FINDINGS POSTERIOR CIRCULATION: --Vertebral arteries: Normal --Inferior  cerebellar arteries: Normal. --Basilar artery: Normal. --Superior cerebellar arteries: Normal. --Posterior cerebral arteries: Normal. ANTERIOR CIRCULATION: --Intracranial internal carotid arteries: Normal. --Anterior cerebral arteries (ACA): Normal. --Middle cerebral arteries (MCA): Normal. ANATOMIC VARIANTS: None IMPRESSION: Normal MRI/MRA of the brain. Electronically Signed   By: Deatra Robinson M.D.   On: 07/30/2021 02:36  ? ?ECHOCARDIOGRAM COMPLETE ? ?Result Date: 07/30/2021 ?   ECHOCARDIOGRAM REPORT   Patient Name:   [name] [name] Date of Exam: 07/30/2021 Medical Rec #:  [id number]    Height:       62.0 in Accession #:    [id number]   Weight:       188.1 lb Date of Birth:  03-07-1981    BSA:          1.862 m? Patient Age:    41 years     BP:           109/57 mmHg Patient Gender: F            HR:           77 bpm. Exam Location:  Inpatient Procedure: 2D Echo, Cardiac Doppler, Color Doppler and Intracardiac            Opacification Agent Indications:    CVA  History:        Patient has no prior history of Echocardiogram examinations.  Sonographer:    Neomia Dear RDCS Referring Phys: 53 MCNEILL P KIRKPATRICK  Sonographer Comments: Suboptimal apical window. IMPRESSIONS  1. Left ventricular ejection fraction, by estimation, is 65 to 70%. The left ventricle has normal function. The left ventricle has no regional wall motion abnormalities. Left ventricular diastolic parameters were normal.  2. Right ventricular systolic function is normal. The right ventricular size is normal.  3. The mitral valve is normal in structure. Trivial mitral valve regurgitation. No evidence of mitral stenosis.  4. The aortic valve is normal in structure. Aortic valve regurgitation is not visualized. No aortic stenosis is present.  5. The inferior vena cava is normal in size with greater than 50% respiratory variability, suggesting right atrial pressure of 3 mmHg. FINDINGS  Left Ventricle: Left ventricular ejection fraction, by estimation, is 65  to 70%. The left ventricle has normal function. The left ventricle has no regional wall motion abnormalities. The left ventricular internal cavity size was normal in size. There is  no left ventricular hypertrophy. Left ventricular diastolic parameters were normal. Normal left ventricular filling pressure. Right Ventricle: The right ventricular size is normal. No increase in right ventricular wall thickness. Right ventricular systolic function is normal. Left Atrium: Left atrial size was normal in size. Right Atrium: Right atrial size was normal in size. Pericardium: There is no evidence of pericardial effusion. Mitral Valve: The mitral valve is normal in structure. Trivial mitral valve regurgitation. No evidence of mitral valve stenosis. Tricuspid Valve: The tricuspid valve is normal in structure. Tricuspid valve regurgitation is trivial. No evidence of tricuspid stenosis. Aortic Valve: The aortic valve is normal in structure. Aortic valve regurgitation is not visualized. No aortic stenosis is present. Aortic valve mean gradient measures 3.0 mmHg. Aortic valve peak gradient measures 6.7 mmHg. Aortic valve area, by VTI measures 2.81 cm?. Pulmonic Valve: The pulmonic valve was normal in  structure. Pulmonic valve regurgitation is not visualized. No evidence of pulmonic stenosis. Aorta: The aortic root is normal in size and structure. Venous: The inferior vena cava is normal in size with greater than 50% respiratory variability, suggesting right atrial pressure of 3 mmHg. IAS/Shunts: No atrial level shunt detected by color flow Doppler.  LEFT VENTRICLE PLAX 2D LVIDd:         4.00 cm     Diastology LVIDs:         2.40 cm     LV e' medial:    10.00 cm/s LV PW:         0.90 cm     LV E/e' medial:  7.4 LV IVS:        1.00 cm     LV e' lateral:   12.30 cm/s LVOT diam:     1.90 cm     LV E/e' lateral: 6.0 LV SV:         61 LV SV Index:   33 LVOT Area:     2.84 cm?  LV Volumes (MOD) LV vol d, MOD A4C: 46.9 ml LV vol s, MOD  A4C: 10.1 ml LV SV MOD A4C:     46.9 ml RIGHT VENTRICLE RV Basal diam:  2.80 cm RV Mid diam:    1.70 cm RV S prime:     10.80 cm/s TAPSE (M-mode): 2.5 cm LEFT ATRIUM             Index        RIGHT ATRIU

## 2021-07-31 DIAGNOSIS — R2990 Unspecified symptoms and signs involving the nervous system: Secondary | ICD-10-CM

## 2021-07-31 LAB — HIV ANTIBODY (ROUTINE TESTING W REFLEX): HIV Screen 4th Generation wRfx: NONREACTIVE

## 2021-07-31 MED ORDER — ASPIRIN 81 MG PO TBEC: 81.0000 mg | DELAYED_RELEASE_TABLET | Freq: Every day | ORAL | 11 refills | Status: AC

## 2021-07-31 MED ORDER — ASPIRIN EC 81 MG PO TBEC: 81.0000 mg | DELAYED_RELEASE_TABLET | Freq: Every day | ORAL | Status: DC

## 2021-07-31 MED ORDER — ATORVASTATIN CALCIUM 80 MG PO TABS: 80.0000 mg | ORAL_TABLET | Freq: Every evening | ORAL | 1 refills | Status: AC

## 2021-07-31 NOTE — Discharge Summary (Addendum)
Stroke Discharge Summary  ?Patient ID: [name] [name]    l ?  MRN: [id number]    ?  DOB: 1980-07-05 ? ?Date of Admission: 07/29/2021 ?Date of Discharge: 07/31/2021 ? ?Attending Physician:  Stroke, Md, MD, Stroke MD ?Consultant(s):    None  ?Patient's PCP:  System, Provider Not In ? ?DISCHARGE DIAGNOSIS: Strokelike episode treated with TNK, possibly conversion disorder in setting of increased stress ?Active Problems: ?Mild Hyperlipidimia ? ? ?Allergies as of 07/31/2021   ? ?   Reactions  ? Iodine Anaphylaxis  ? Shellfish Allergy   ? Benadryl [diphenhydramine]   ? Demerol [meperidine Hcl]   ? ?  ? ?  ?Medication List  ?  ? ?TAKE these medications   ? ?amitriptyline 50 MG tablet ?Commonly known as: ELAVIL ?Take 50 mg by mouth at bedtime. ?  ?aspirin 81 MG EC tablet ?Take 1 tablet (81 mg total) by mouth daily. Swallow whole. ?  ?atorvastatin 80 MG tablet ?Commonly known as: LIPITOR ?Take 1 tablet (80 mg total) by mouth every evening. ?  ?OVER THE COUNTER MEDICATION ?Take 1 tablet by mouth daily. Nitro-Oxygen Booster ?  ?Turmeric 500 MG Caps ?Take 1 capsule by mouth daily. ?  ? ?  ? ? ?LABORATORY STUDIES ?CBC ?   ?Component Value Date/Time  ? WBC 10.8 (H) 07/29/2021 2110  ? RBC 4.54 07/29/2021 2110  ? HGB 14.3 07/29/2021 2110  ? HCT 41.7 07/29/2021 2110  ? PLT 304 07/29/2021 2110  ? MCV 91.9 07/29/2021 2110  ? MCH 31.5 07/29/2021 2110  ? MCHC 34.3 07/29/2021 2110  ? RDW 13.0 07/29/2021 2110  ? LYMPHSABS 1.3 07/29/2021 2110  ? MONOABS 0.3 07/29/2021 2110  ? EOSABS 0.0 07/29/2021 2110  ? BASOSABS 0.0 07/29/2021 2110  ? ?CMP ?   ?Component Value Date/Time  ? NA 138 07/29/2021 2110  ? K 3.9 07/29/2021 2110  ? CL 108 07/29/2021 2110  ? CO2 23 07/29/2021 2110  ? GLUCOSE 107 (H) 07/29/2021 2110  ? BUN 7 07/29/2021 2110  ? CREATININE 0.92 07/29/2021 2110  ? CALCIUM 9.3 07/29/2021 2110  ? PROT 7.6 07/29/2021 2110  ? ALBUMIN 3.6 07/29/2021 2110  ? AST 30 07/29/2021 2110  ? ALT 19 07/29/2021 2110  ? ALKPHOS 72 07/29/2021 2110  ? BILITOT  0.5 07/29/2021 2110  ? GFRNONAA >60 07/29/2021 2110  ? ?COAGS ?Lab Results  ?Component Value Date  ? INR 0.9 07/29/2021  ? ?Lipid Panel ?   ?Component Value Date/Time  ? CHOL 208 (H) 07/30/2021 0453  ? TRIG 40 07/30/2021 0453  ? HDL 52 07/30/2021 0453  ? CHOLHDL 4.0 07/30/2021 0453  ? VLDL 8 07/30/2021 0453  ? LDLCALC 148 (H) 07/30/2021 0453  ? ?HgbA1C  ?Lab Results  ?Component Value Date  ? HGBA1C 5.3 07/30/2021  ? ?Urinalysis ?No results found for: COLORURINE, APPEARANCEUR, LABSPEC, PHURINE, GLUCOSEU, HGBUR, BILIRUBINUR, KETONESUR, PROTEINUR, UROBILINOGEN, NITRITE, LEUKOCYTESUR ?Urine Drug Screen No results found for: LABOPIA, COCAINSCRNUR, LABBENZ, AMPHETMU, THCU, LABBARB  ?Alcohol Level ?   ?Component Value Date/Time  ? ETH <10 07/29/2021 2110  ? ? ? ?SIGNIFICANT DIAGNOSTIC STUDIES ?CT HEAD WO CONTRAST ? ?Result Date: 07/30/2021 ?CLINICAL DATA:  Stroke follow-up EXAM: CT HEAD WITHOUT CONTRAST TECHNIQUE: Contiguous axial images were obtained from the base of the skull through the vertex without intravenous contrast. RADIATION DOSE REDUCTION: This exam was performed according to the departmental dose-optimization program which includes automated exposure control, adjustment of the mA and/or kV according to patient size and/or use of iterative  reconstruction technique. COMPARISON:  07/29/2021 CT head, 07/30/2021 MRI head FINDINGS: Brain: No evidence of acute infarction, hemorrhage, cerebral edema, mass, mass effect, or midline shift. No hydrocephalus or extra-axial fluid collection. Vascular: No hyperdense vessel. Skull: Normal. Negative for fracture or focal lesion. Sinuses/Orbits: No acute finding. Other: The mastoid air cells are well aerated. IMPRESSION: IMPRESSION No acute intracranial process. Electronically Signed   By: Wiliam KeAlison  Vasan M.D.   On: 07/30/2021 19:52  ? ?MR ANGIO HEAD WO CONTRAST ? ?Result Date: 07/30/2021 ?CLINICAL DATA:  Stroke follow-up.  Allergic reaction. EXAM: MRI HEAD WITHOUT CONTRAST MRA  HEAD WITHOUT CONTRAST TECHNIQUE: Multiplanar, multi-echo pulse sequences of the brain and surrounding structures were acquired without intravenous contrast. Angiographic images of the Circle of Willis were acquired using MRA technique without intravenous contrast. COMPARISON:  No pertinent prior exam. FINDINGS: MRI HEAD FINDINGS Brain: No acute infarct, mass effect or extra-axial collection. No acute or chronic hemorrhage. Normal white matter signal, parenchymal volume and CSF spaces. The midline structures are normal. Vascular: Major flow voids are preserved. Skull and upper cervical spine: Normal calvarium and skull base. Visualized upper cervical spine and soft tissues are normal. Sinuses/Orbits:No paranasal sinus fluid levels or advanced mucosal thickening. No mastoid or middle ear effusion. Normal orbits. MRA HEAD FINDINGS POSTERIOR CIRCULATION: --Vertebral arteries: Normal --Inferior cerebellar arteries: Normal. --Basilar artery: Normal. --Superior cerebellar arteries: Normal. --Posterior cerebral arteries: Normal. ANTERIOR CIRCULATION: --Intracranial internal carotid arteries: Normal. --Anterior cerebral arteries (ACA): Normal. --Middle cerebral arteries (MCA): Normal. ANATOMIC VARIANTS: None IMPRESSION: Normal MRI/MRA of the brain. Electronically Signed   By: Deatra RobinsonKevin  Herman M.D.   On: 07/30/2021 02:36  ? ?MR BRAIN WO CONTRAST ? ?Result Date: 07/30/2021 ?CLINICAL DATA:  Stroke follow-up.  Allergic reaction. EXAM: MRI HEAD WITHOUT CONTRAST MRA HEAD WITHOUT CONTRAST TECHNIQUE: Multiplanar, multi-echo pulse sequences of the brain and surrounding structures were acquired without intravenous contrast. Angiographic images of the Circle of Willis were acquired using MRA technique without intravenous contrast. COMPARISON:  No pertinent prior exam. FINDINGS: MRI HEAD FINDINGS Brain: No acute infarct, mass effect or extra-axial collection. No acute or chronic hemorrhage. Normal white matter signal, parenchymal volume and  CSF spaces. The midline structures are normal. Vascular: Major flow voids are preserved. Skull and upper cervical spine: Normal calvarium and skull base. Visualized upper cervical spine and soft tissues are normal. Sinuses/Orbits:No paranasal sinus fluid levels or advanced mucosal thickening. No mastoid or middle ear effusion. Normal orbits. MRA HEAD FINDINGS POSTERIOR CIRCULATION: --Vertebral arteries: Normal --Inferior cerebellar arteries: Normal. --Basilar artery: Normal. --Superior cerebellar arteries: Normal. --Posterior cerebral arteries: Normal. ANTERIOR CIRCULATION: --Intracranial internal carotid arteries: Normal. --Anterior cerebral arteries (ACA): Normal. --Middle cerebral arteries (MCA): Normal. ANATOMIC VARIANTS: None IMPRESSION: Normal MRI/MRA of the brain. Electronically Signed   By: Deatra RobinsonKevin  Herman M.D.   On: 07/30/2021 02:36  ? ?ECHOCARDIOGRAM COMPLETE ? ?Result Date: 07/30/2021 ?   ECHOCARDIOGRAM REPORT   Patient Name:   [name] [name] Date of Exam: 07/30/2021 Medical Rec #:  [id number]    Height:       62.0 in Accession #:    [id number]   Weight:       188.1 lb Date of Birth:  1980/08/29    BSA:          1.862 m? Patient Age:    41 years     BP:           109/57 mmHg Patient Gender: F            HR:  77 bpm. Exam Location:  Inpatient Procedure: 2D Echo, Cardiac Doppler, Color Doppler and Intracardiac            Opacification Agent Indications:    CVA  History:        Patient has no prior history of Echocardiogram examinations.  Sonographer:    Neomia Dear RDCS Referring Phys: 78 MCNEILL P KIRKPATRICK  Sonographer Comments: Suboptimal apical window. IMPRESSIONS  1. Left ventricular ejection fraction, by estimation, is 65 to 70%. The left ventricle has normal function. The left ventricle has no regional wall motion abnormalities. Left ventricular diastolic parameters were normal.  2. Right ventricular systolic function is normal. The right ventricular size is normal.  3. The mitral valve is  normal in structure. Trivial mitral valve regurgitation. No evidence of mitral stenosis.  4. The aortic valve is normal in structure. Aortic valve regurgitation is not visualized. No aortic stenosis is prese

## 2021-07-31 NOTE — Progress Notes (Signed)
?  Transition of Care (TOC) Screening Note ? ? ?Patient Details  ?Name: [name] [name] ?Date of Birth: 09/07/1980 ? ? ?Transition of Care (TOC) CM/SW Contact:    ?Benard Halsted, LCSW ?Phone Number: ?07/31/2021, 9:03 AM ? ? ? ?Transition of Care Department [facility]) has reviewed patient and no TOC needs have been identified at this time. We will continue to monitor patient advancement through interdisciplinary progression rounds. If new patient transition needs arise, please place a TOC consult. ? ? ?

## 2021-07-31 NOTE — Discharge Instructions (Addendum)
[name] [name], you were admitted to the hospital after having an allergic reaction to seafood.  While you were being treated for this, you experienced weakness and tingling on your left side.  These symptoms were thought to be caused by a stroke, and you were treated with TNK.  Your MRI showed that you did not have a stroke.  You will need to follow up with your PCP within one week after discharge and be seen in the stroke clinic in 6-8 weeks.  You will need to take atorvastatin to control your high cholesterol and aspirin to reduce your risk of stroke in the future.  You can return to work this Thursday, 08/02/2021. ?

## 2021-07-31 NOTE — Progress Notes (Signed)
Occupational Therapy Treatment ?Patient Details ?Name: [name] [name] ?MRN: [id number] ?DOB: 1980/09/12 ?Today's Date: 07/31/2021 ? ? ?History of present illness Pt is 41 yo female who presented to the hospital for an allergic reaction to seafood, received epi pen. Then she developed L side weakness and tingling and was given TNK 2139 4/23. MRI neg.  PMH: muscle spasms, HA, paresthesias L side, hemorrhagic ovarian cyst ?  ?OT comments ? [name] [name] is making good progress. She was able to complete grooming, bathing, dressing, toileting and functional ambulation without AD given supervision A for safety only. Pt continues to move slow and guarded however no overt LOB. She will benefit from OT acutely and in the OP setting to progress pt back to her indep baseline including working as a Advertising copywriter.   ? ?Recommendations for follow up therapy are one component of a multi-disciplinary discharge planning process, led by the attending physician.  Recommendations may be updated based on patient status, additional functional criteria and insurance authorization. ?   ?Follow Up Recommendations ? Outpatient OT  ?  ?Assistance Recommended at Discharge Intermittent Supervision/Assistance  ?Patient can return home with the following ? A little help with walking and/or transfers;A little help with bathing/dressing/bathroom;Assist for transportation ?  ?Equipment Recommendations ? Other (comment);Tub/shower seat  ?  ?   ?Precautions / Restrictions Precautions ?Precautions: Fall ?Restrictions ?Weight Bearing Restrictions: No  ? ? ?  ? ?Mobility Bed Mobility ?  ?  ?  ?  ?  ?  ?  ?General bed mobility comments: in chiar upon arrival ?  ? ?Transfers ?Overall transfer level: Needs assistance ?Equipment used: None ?Transfers: Sit to/from Stand ?Sit to Stand: Supervision ?  ?  ?  ?  ?  ?General transfer comment: slow and guarded ?  ?  ?Balance Overall balance assessment: Needs assistance ?Sitting-balance support: Feet supported ?Sitting  balance-Leahy Scale: Good ?  ?  ?Standing balance support: No upper extremity supported, During functional activity ?Standing balance-Leahy Scale: Fair ?Standing balance comment: stood at the sink to complete all ADLs ?  ?  ?   ? ?ADL either performed or assessed with clinical judgement  ? ?ADL Overall ADL's : Needs assistance/impaired ?  ?  ?Grooming: Supervision/safety;Standing ?  ?Upper Body Bathing: Supervision/ safety;Standing ?  ?Lower Body Bathing: Supervison/ safety;Sit to/from stand ?  ?Upper Body Dressing : Supervision/safety;Standing ?  ?Lower Body Dressing: Supervision/safety;Sit to/from stand ?  ?Toilet Transfer: Supervision/safety;Ambulation;Regular Toilet ?  ?Toileting- Clothing Manipulation and Hygiene: Supervision/safety;Sit to/from stand ?  ?  ?  ?Functional mobility during ADLs: Supervision/safety ?General ADL Comments: no RW this day, slow guarded gait. no overt LOB. supervision for ADLs this sesssion ?  ? ?Extremity/Trunk Assessment Upper Extremity Assessment ?Upper Extremity Assessment: LUE deficits/detail ?LUE Deficits / Details: painful shoulder AROM, baseline. ?LUE Sensation: WNL ?LUE Coordination: decreased fine motor;decreased gross motor ?  ?Lower Extremity Assessment ?Lower Extremity Assessment: Overall WFL for tasks assessed ?  ?  ?  ? ?Vision   ?Vision Assessment?: No apparent visual deficits ?  ?Perception Perception ?Perception: Within Functional Limits ?  ?Praxis Praxis ?Praxis: Not tested ?  ? ?Cognition Arousal/Alertness: Awake/alert ?Behavior During Therapy: [facility] for tasks assessed/performed ?Overall Cognitive Status: Within Functional Limits for tasks assessed ?  ?  ?  ?  ?General Comments: eager to go home today, cog [facility] for sequencing and sustaining ADLs, good insight to safety and problem solving noted ?  ?  ?   ?   ?   ?General Comments VSS onRA  ? ? ?  Pertinent Vitals/ Pain       Pain Assessment ?Pain Assessment: Faces ?Faces Pain Scale: Hurts a little bit ?Pain Location: R  back ?Pain Descriptors / Indicators: Discomfort ?Pain Intervention(s): Limited activity within patient's tolerance, Monitored during session ? ? ?Frequency ? Min 2X/week  ? ? ? ? ?  ?Progress Toward Goals ? ?OT Goals(current goals can now be found in the care plan section) ? Progress towards OT goals: Progressing toward goals ? ?Acute Rehab OT Goals ?Patient Stated Goal: home today ?OT Goal Formulation: With patient ?Time For Goal Achievement: 08/13/21 ?Potential to Achieve Goals: Good ?ADL Goals ?Pt Will Perform Upper Body Dressing: Independently;standing ?Pt Will Perform Lower Body Dressing: Independently;sit to/from stand ?Pt Will Transfer to Toilet: Independently ?Additional ADL Goal #1: Pt will demonstrate increased activity tolerance to independently complete at least ADLs in standing  ?Plan Discharge plan remains appropriate   ? ?   ?AM-PAC OT "6 Clicks" Daily Activity     ?Outcome Measure ? ? Help from another person eating meals?: None ?Help from another person taking care of personal grooming?: A Little ?Help from another person toileting, which includes using toliet, bedpan, or urinal?: A Little ?Help from another person bathing (including washing, rinsing, drying)?: A Little ?Help from another person to put on and taking off regular upper body clothing?: A Little ?Help from another person to put on and taking off regular lower body clothing?: A Little ?6 Click Score: 19 ? ?  ?End of Session   ? ?OT Visit Diagnosis: Unsteadiness on feet (R26.81);Other abnormalities of gait and mobility (R26.89);Muscle weakness (generalized) (M62.81);Pain ?  ?Activity Tolerance Patient tolerated treatment well ?  ?Patient Left in chair;with call bell/phone within reach ?  ?Nurse Communication Mobility status ?  ? ?   ? ?Time: 0350-0938 ?OT Time Calculation (min): 27 min ? ?Charges: OT General Charges ?$OT Visit: 1 Visit ?OT Treatments ?$Self Care/Home Management : 23-37 mins ? ? ? ?[name] [name] A Meagon Duskin ?07/31/2021, 10:43 AM ?

## 2021-11-06 ENCOUNTER — Other Ambulatory Visit: Payer: Self-pay

## 2021-11-06 NOTE — Patient Outreach (Signed)
[facility]) Care Management  11/06/2021  [name] [name] September 15, 1980 [id number]   First telephone outreach attempt to obtain mRS. No answer. Left message for returned call.  [name] [facility] Management Assistant [phone]

## 2021-11-12 ENCOUNTER — Other Ambulatory Visit: Payer: Self-pay

## 2021-11-12 NOTE — Patient Outreach (Signed)
[facility]) Care Management  11/12/2021  [name] [name] [birth year] [id number]   Second telephone outreach attempt to obtain mRS. No answer. Left message for returned call.  [name] [facility] Management Assistant [phone]

## 2021-11-13 ENCOUNTER — Other Ambulatory Visit: Payer: Self-pay

## 2021-11-13 NOTE — Patient Outreach (Signed)
[facility]) Care Management  11/13/2021  [name] [name] Sep 06, 1980 [id number]   3 outreach attempts were completed to obtain mRs. mRs could not be obtained because patient never returned my calls. mRs=7    [name] Care Management Assistant [phone]

## 2022-03-06 ENCOUNTER — Emergency Department (HOSPITAL_COMMUNITY): Payer: Medicaid Other

## 2022-03-06 ENCOUNTER — Emergency Department (HOSPITAL_COMMUNITY)
Admission: EM | Admit: 2022-03-06 | Discharge: 2022-03-06 | Disposition: A | Discharge status: Home / Self Care | Payer: Medicaid Other | Attending: Emergency Medicine | Admitting: Emergency Medicine

## 2022-03-06 DIAGNOSIS — M25562 Pain in left knee: Secondary | ICD-10-CM | POA: Insufficient documentation

## 2022-03-06 DIAGNOSIS — X509XXA Other and unspecified overexertion or strenuous movements or postures, initial encounter: Secondary | ICD-10-CM | POA: Insufficient documentation

## 2022-03-06 DIAGNOSIS — Z7982 Long term (current) use of aspirin: Secondary | ICD-10-CM | POA: Insufficient documentation

## 2022-03-06 MED ORDER — OXYCODONE HCL 5 MG PO TABS
5.0000 mg | ORAL_TABLET | Freq: Once | ORAL | Status: AC
  Administered 2022-03-06: 5 mg via ORAL
  Filled 2022-03-06: qty 1

## 2022-03-06 NOTE — ED Triage Notes (Signed)
[place] [facility] [place]. Pt was walking down stairs, L knee gave out and twisted. Pt states she heard pop and has decreased sensation. Pedal Pulse +2. Denies SOB.

## 2022-03-06 NOTE — Progress Notes (Signed)
Orthopedic Tech Progress Note Patient Details:  Isaac Lacson [birth year] [id number]  Ortho Devices Type of Ortho Device: Crutches, Knee Immobilizer Ortho Device/Splint Location: LLE Ortho Device/Splint Interventions: Ordered, Application, Adjustment   Post Interventions Patient Tolerated: Well Instructions Provided: Adjustment of device, Care of device, Poper ambulation with device  [place] A Gerilyn Pilgrim 03/06/2022, 10:01 PM

## 2022-03-06 NOTE — ED Notes (Signed)
Patient transported to X-ray 

## 2022-03-06 NOTE — ED Provider Notes (Signed)
[facility] EMERGENCY DEPARTMENT Provider Note   CSN: [id number] Arrival date & time: 03/06/22  2000     History  Chief Complaint  Patient presents with   Knee Injury    [name] [name] is a 41 y.o. female.  Patient here with left knee pain/left upper leg pain after she was stepping down from stairs off of a train.  She heard a pop in her left knee.  She has a history of ACL repair in this knee.  Patient otherwise has no major medical problems.  She denies hitting her head or losing consciousness.  No pain in the lower legs.  Nothing makes it worse or better.  Denies any other extremity pain.  The history is provided by the patient.       Home Medications Prior to Admission medications   Medication Sig Start Date End Date Taking? Authorizing Provider  OVER THE COUNTER MEDICATION Take 2 capsules by mouth See admin instructions. Nitro-Oxygen Booster- Take 2 capsules by mouth once a day   Yes [provider]  aspirin EC 81 MG EC tablet Take 1 tablet (81 mg total) by mouth daily. Swallow whole. Patient not taking: Reported on 03/06/2022 07/31/21   de Saintclair Halsted, Cortney E, NP  atorvastatin (LIPITOR) 80 MG tablet Take 1 tablet (80 mg total) by mouth every evening. Patient not taking: Reported on 03/06/2022 07/31/21   de Saintclair Halsted, Cortney E, NP      Allergies    Diphenhydramine, Diphenhydramine-zinc acetate, Iodinated contrast media, Iodine, Shellfish-derived products, Demerol [meperidine hcl], and Morphine    Review of Systems   Review of Systems  Physical Exam Updated Vital Signs BP 117/73   Pulse 71   Resp 20   SpO2 99%  Physical Exam Vitals and nursing note reviewed.  Constitutional:      General: She is not in acute distress.    Appearance: She is well-developed.  HENT:     Head: Normocephalic and atraumatic.     Nose: Nose normal.     Mouth/Throat:     Mouth: Mucous membranes are moist.  Eyes:     Extraocular Movements: Extraocular  movements intact.     Conjunctiva/sclera: Conjunctivae normal.     Pupils: Pupils are equal, round, and reactive to light.  Cardiovascular:     Rate and Rhythm: Normal rate and regular rhythm.     Pulses: Normal pulses.     Heart sounds: No murmur heard. Pulmonary:     Effort: Pulmonary effort is normal. No respiratory distress.     Breath sounds: Normal breath sounds.  Abdominal:     General: Abdomen is flat.     Palpations: Abdomen is soft.     Tenderness: There is no abdominal tenderness.  Musculoskeletal:        General: Tenderness present. No swelling.     Cervical back: Neck supple.     Comments: Left lower leg exam : difficult to assess range of motion of left lower extremity secondary to pain, I am able to flex and extend her leg without much issue, there is no obvious deformity to the left lower leg, she is tender in the left knee, left hip  Skin:    General: Skin is warm and dry.     Capillary Refill: Capillary refill takes less than 2 seconds.  Neurological:     General: No focal deficit present.     Mental Status: She is alert and oriented to person, place, and time.  Cranial Nerves: No cranial nerve deficit.     Sensory: No sensory deficit.     Motor: No weakness.     Coordination: Coordination normal.  Psychiatric:        Mood and Affect: Mood normal.     ED Results / Procedures / Treatments   Labs (all labs ordered are listed, but only abnormal results are displayed) Labs Reviewed - No data to display  EKG None  Radiology DG Hip Unilat With Pelvis 2-3 Views Left  Result Date: 03/06/2022 CLINICAL DATA:  Twisted leg with subsequent pain EXAM: DG HIP (WITH OR WITHOUT PELVIS) 2-3V LEFT COMPARISON:  None Available. FINDINGS: There is no evidence of hip fracture or dislocation. There is no evidence of arthropathy or other focal bone abnormality. IMPRESSION: Negative. Electronically Signed   By: Minerva Fester M.D.   On: 03/06/2022 21:26   DG Knee Complete 4  Views Left  Result Date: 03/06/2022 CLINICAL DATA:  Knee gave out and twisted.  Pain EXAM: LEFT KNEE - COMPLETE 4+ VIEW COMPARISON:  None Available. FINDINGS: No acute fracture or dislocation. Mild hypertrophic degenerative arthritis left knee. Trace effusion. IMPRESSION: No acute fracture or dislocation. Electronically Signed   By: Minerva Fester M.D.   On: 03/06/2022 21:23    Procedures Procedures    Medications Ordered in ED Medications  oxyCODONE (Oxy IR/ROXICODONE) immediate release tablet 5 mg (5 mg Oral Given 03/06/22 2042)    ED Course/ Medical Decision Making/ A&P                           Medical Decision Making Amount and/or Complexity of Data Reviewed Radiology: ordered.  Risk Prescription drug management.   [name] [name] is here with left lower leg pain after injury.  Normal vitals.  No fever.  History of ACL repair in the left knee in the past.  Patient was walking down steps from a train when she had her left knee buckle.  She heard a pop.  Pain mostly in the left knee.  Differential diagnosis likely soft tissue injury/knee sprain/may be quad tendon or patellar tendon tear.  Very difficult to do exam on her as she is in discomfort.  She will give me a good effort to try to flex and extend at her left knee.  I am able to range her knee and hip without much difficulty.  Will get x-rays to evaluate for fracture of the left hip, femur, knee but my suspicion is that this is likely a soft tissue injury.  She is neurovascular neuromuscular intact otherwise.  She not hit her head or lose consciousness.  She is on blood thinners.  X-rays per my review interpretation show no evidence of fracture or dislocation.  Patient placed in a knee immobilizer.  Placed with crutches.  My suspicion is likely knee sprain.  There could be some sort of patellar tendon or quad tendon tear as well.  She was made aware that she needs to follow-up with orthopedics.  She is neurovascular muscle intact  otherwise.  Discharged in good condition.  This chart was dictated using voice recognition software.  Despite best efforts to proofread,  errors can occur which can change the documentation meaning.         Final Clinical Impression(s) / ED Diagnoses Final diagnoses:  Acute pain of left knee    Rx / DC Orders ED Discharge Orders     None  Virgina Norfolk, DO 03/06/22 2234

## 2022-03-06 NOTE — Discharge Instructions (Addendum)
Wear knee immobilizer for support and use crutches.  Can remove knee immobilizer but would use it for walking.  Follow-up with orthopedics, Dr. Roda Shutters.  Recommend 1000 mg of Tylenol every 6 hours as needed for pain.  Recommend 400 mg of ibuprofen every 8 hours as needed for pain.  Recommend ice.
